# Patient Record
Sex: Female | Born: 1985 | Race: Black or African American | Hispanic: No | Marital: Single | Smoking: Never smoker
Health system: Southern US, Community
[De-identification: ages and names within clinical notes are randomized; demographics above are authoritative.]

## PROBLEM LIST (undated history)

## (undated) DIAGNOSIS — D649 Anemia, unspecified: Secondary | ICD-10-CM

## (undated) HISTORY — PX: EYE SURGERY: SHX253

---

## 2003-05-21 ENCOUNTER — Other Ambulatory Visit: Admission: RE | Admit: 2003-05-21 | Discharge: 2003-05-21 | Payer: Self-pay | Admitting: Obstetrics and Gynecology

## 2003-05-24 ENCOUNTER — Encounter: Payer: Self-pay | Admitting: Obstetrics and Gynecology

## 2003-05-24 ENCOUNTER — Encounter: Admission: RE | Admit: 2003-05-24 | Discharge: 2003-05-24 | Payer: Self-pay | Admitting: Obstetrics and Gynecology

## 2004-09-15 ENCOUNTER — Other Ambulatory Visit: Admission: RE | Admit: 2004-09-15 | Discharge: 2004-09-15 | Payer: Self-pay | Admitting: Obstetrics and Gynecology

## 2004-09-20 ENCOUNTER — Other Ambulatory Visit: Admission: RE | Admit: 2004-09-20 | Discharge: 2004-09-20 | Payer: Self-pay | Admitting: Obstetrics and Gynecology

## 2005-03-26 ENCOUNTER — Ambulatory Visit (HOSPITAL_COMMUNITY): Admission: RE | Admit: 2005-03-26 | Discharge: 2005-03-26 | Payer: Self-pay | Admitting: Obstetrics and Gynecology

## 2005-04-01 ENCOUNTER — Ambulatory Visit (HOSPITAL_COMMUNITY): Admission: RE | Admit: 2005-04-01 | Discharge: 2005-04-01 | Payer: Self-pay | Admitting: Obstetrics and Gynecology

## 2005-11-18 ENCOUNTER — Inpatient Hospital Stay (HOSPITAL_COMMUNITY): Admission: AD | Admit: 2005-11-18 | Discharge: 2005-11-20 | Payer: Self-pay | Admitting: *Deleted

## 2005-12-31 ENCOUNTER — Other Ambulatory Visit: Admission: RE | Admit: 2005-12-31 | Discharge: 2005-12-31 | Payer: Self-pay | Admitting: Obstetrics and Gynecology

## 2006-05-09 ENCOUNTER — Emergency Department (HOSPITAL_COMMUNITY): Admission: EM | Admit: 2006-05-09 | Discharge: 2006-05-09 | Payer: Self-pay | Admitting: Emergency Medicine

## 2006-06-13 IMAGING — US US OB TRANSVAGINAL MODIFY
1 series · 14 of 28 positions shown · non-contrast
Comparison: none

CLINICAL DATA: Known gestation.  Assess for viability and dates.

[Series 1: us ob transvaginal modify · 0.25mm/px · 14 of 53 slices shown]
[im 2/53]
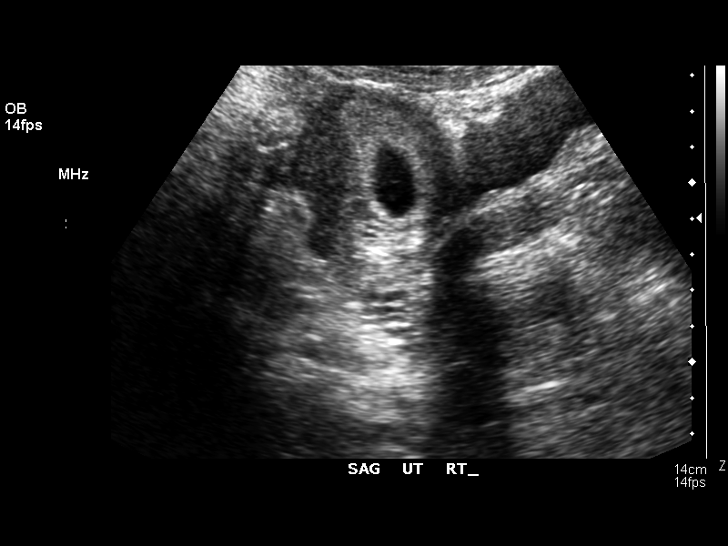
[im 6/53]
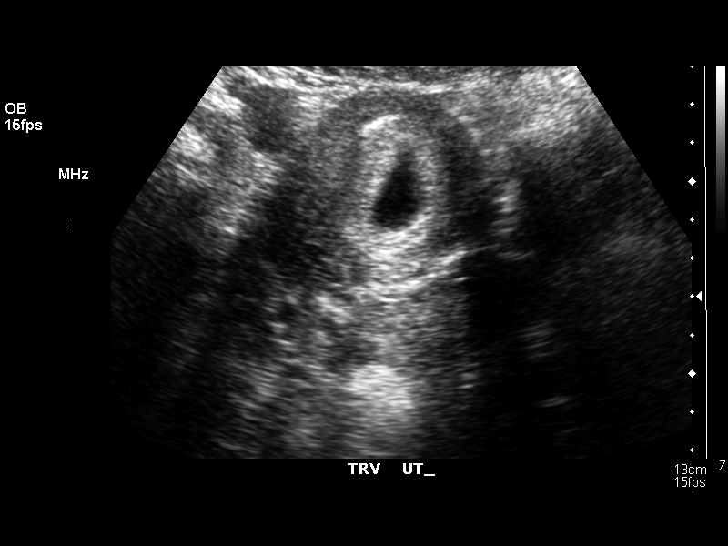
[im 10/53]
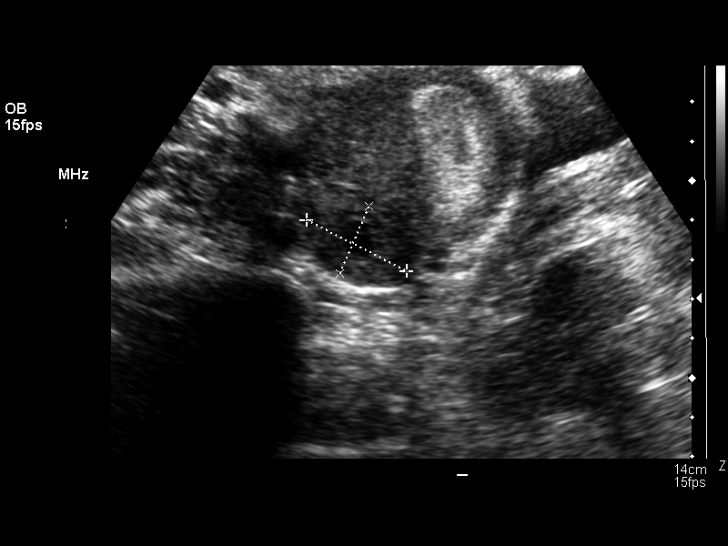
[im 14/53]
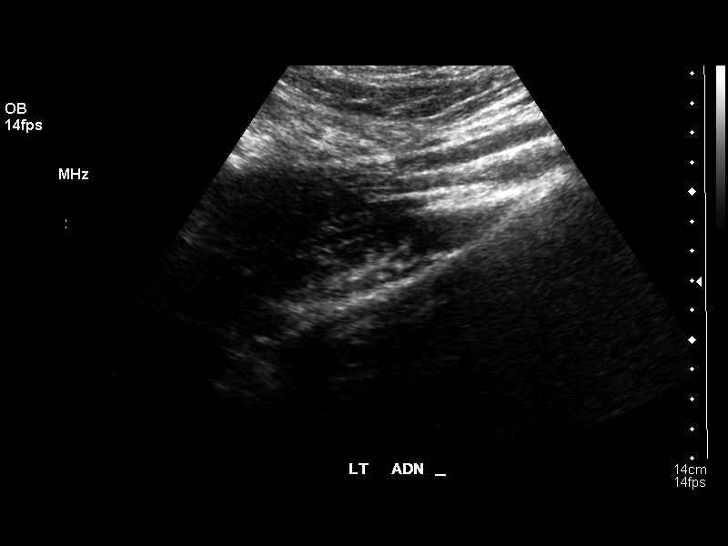
[im 18/53]
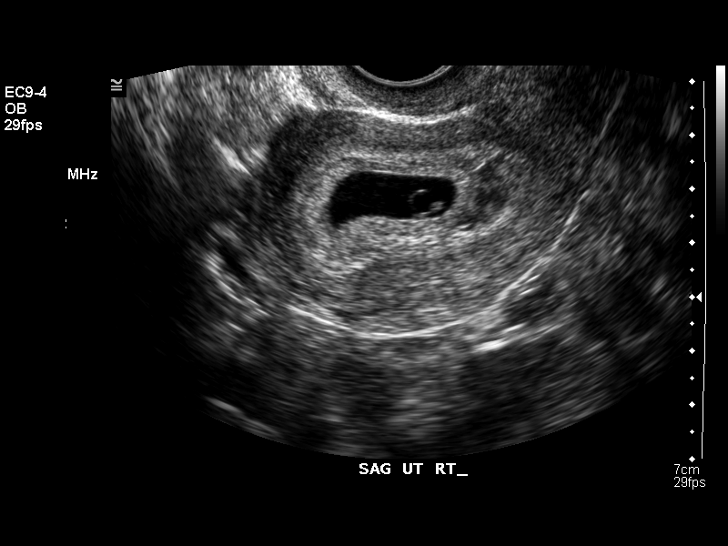
[im 22/53]
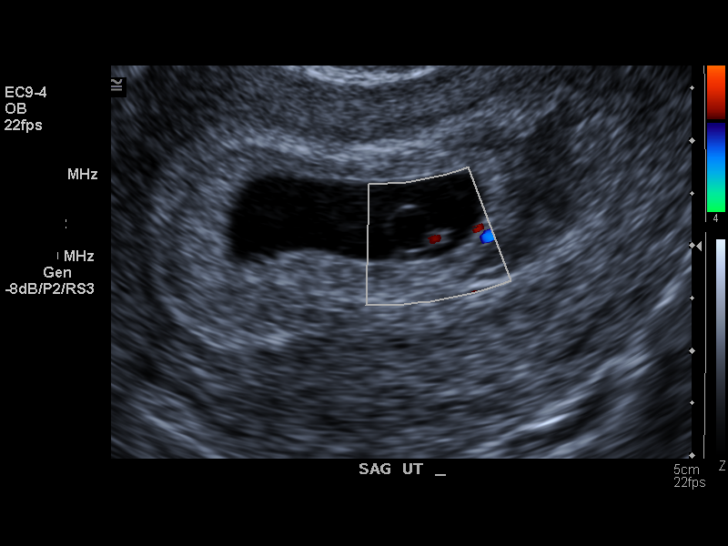
[im 26/53]
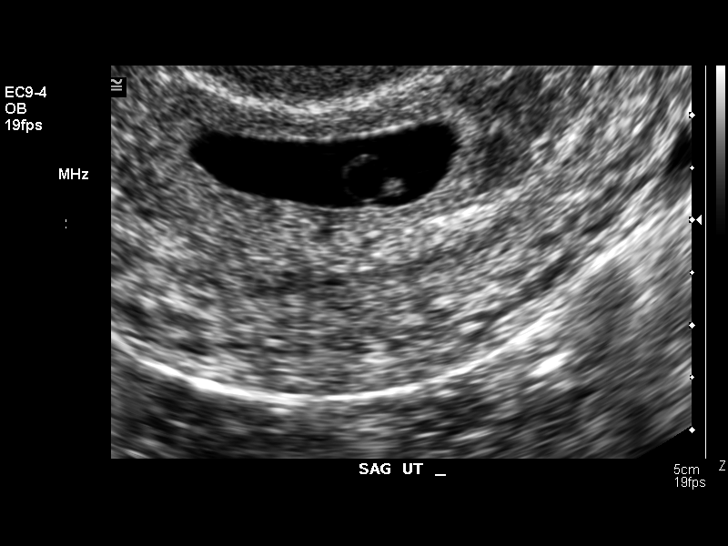
[im 29/53]
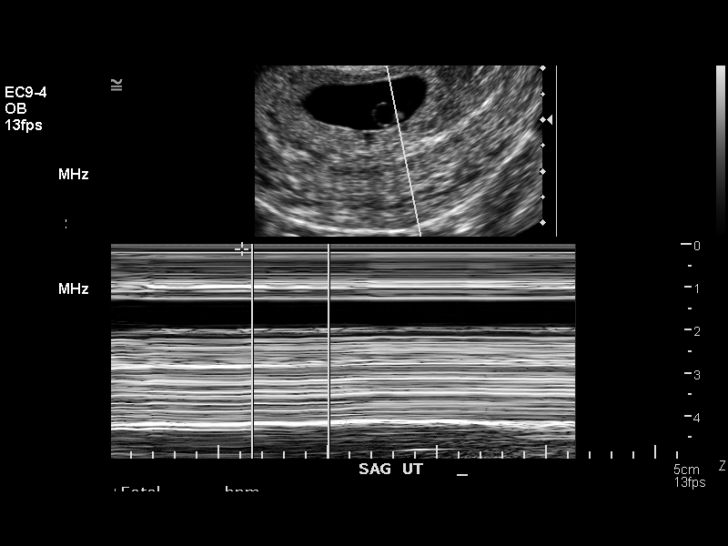
[im 33/53]
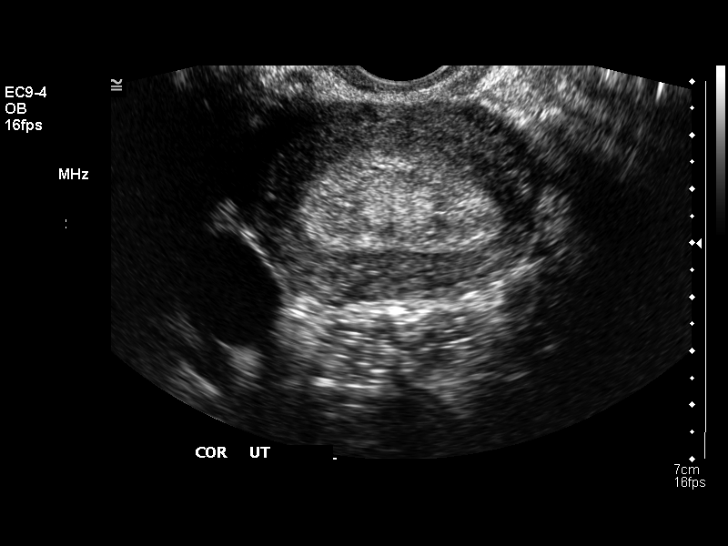
[im 37/53]
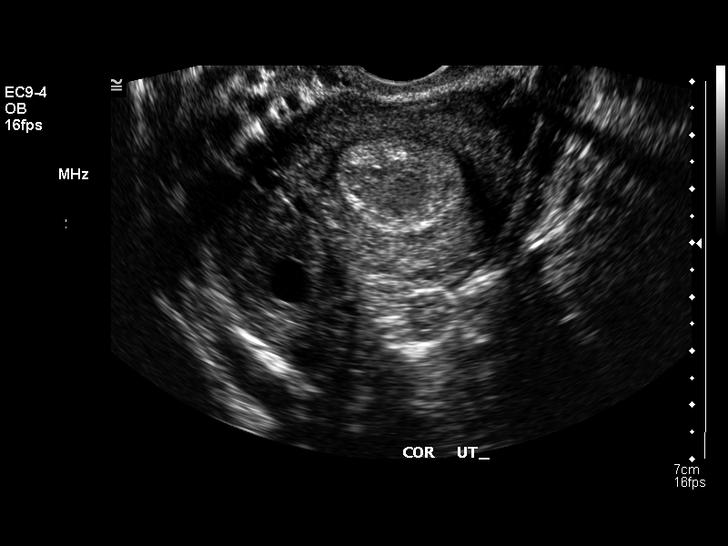
[im 41/53]
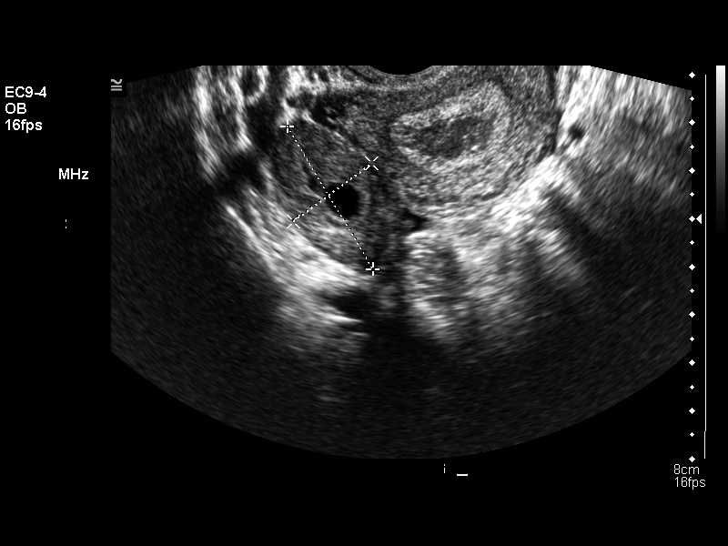
[im 45/53]
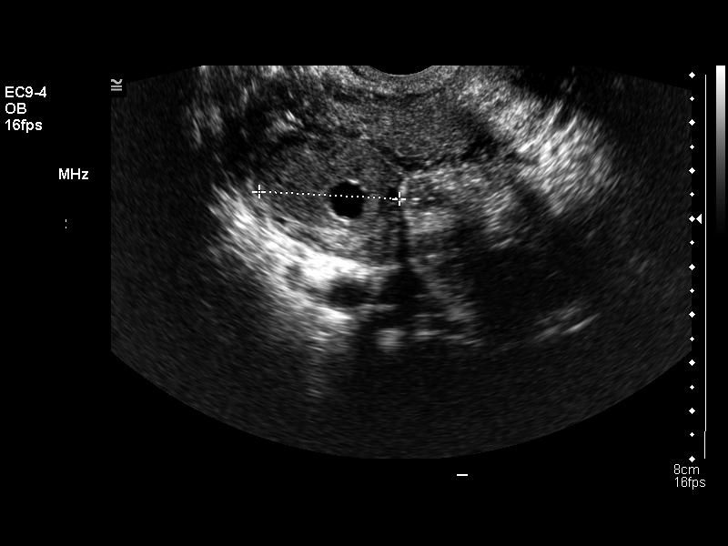
[im 49/53]
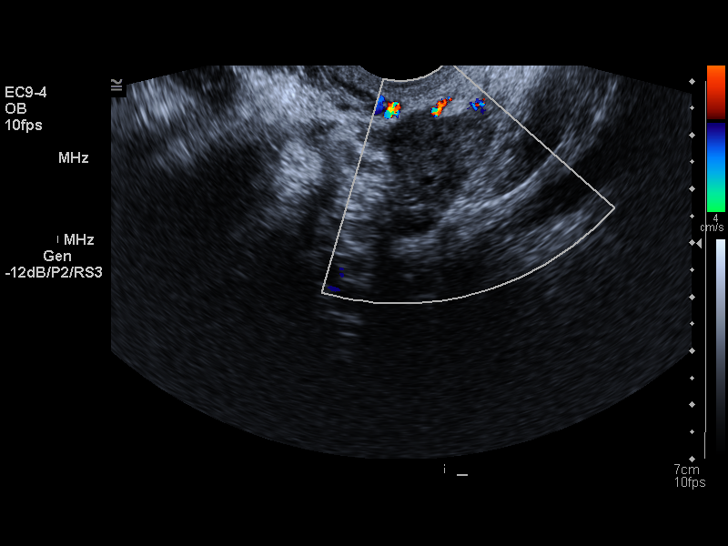
[im 53/53]
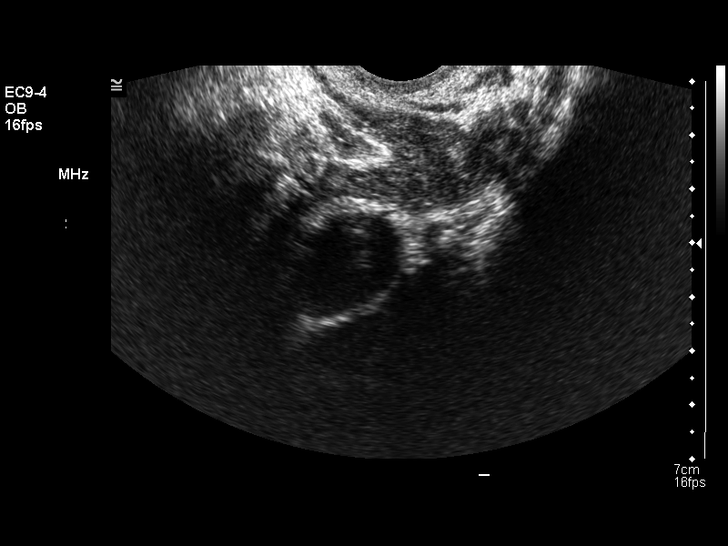

[14 of 28 positions shown; findings below may reference images not displayed]

EARLY OBSTETRICAL ULTRASOUND WITH TRANSVAGINAL:
 Transabdominal and endovaginal scanning was performed.
 Single intrauterine gestational sac is identified with a yolk sac and tiny embryo.  Crown rump length of the embryo is 2.1 mm which correlates to an estimated gestational age of 5 weeks 5 days.  There is a small associated subchorionic hemorrhage.
 The ovaries are sonographically normal with a corpus luteum cyst seen on the right.  There is a trace amount of simple appearing free fluid in the cul-de-sac.
IMPRESSION: Single living intrauterine gestation at 5 weeks 5 days by crown rump length.  The embryonic heart rate is measured at 88 bpm.  Short term interval follow-up ultrasound is recommended to insure appropriate pregnancy progression and correlation with quantitative beta hCG may prove helpful.

## 2009-03-07 ENCOUNTER — Emergency Department (HOSPITAL_COMMUNITY): Admission: EM | Admit: 2009-03-07 | Discharge: 2009-03-07 | Payer: Self-pay | Admitting: Emergency Medicine

## 2011-03-17 LAB — DIFFERENTIAL
Basophils Relative: 0 % (ref 0–1)
Eosinophils Absolute: 0 10*3/uL (ref 0.0–0.7)
Lymphs Abs: 1 10*3/uL (ref 0.7–4.0)
Monocytes Relative: 7 % (ref 3–12)
Neutro Abs: 8 10*3/uL — ABNORMAL HIGH (ref 1.7–7.7)
Neutrophils Relative %: 82 % — ABNORMAL HIGH (ref 43–77)

## 2011-03-17 LAB — URINE MICROSCOPIC-ADD ON

## 2011-03-17 LAB — URINALYSIS, ROUTINE W REFLEX MICROSCOPIC
Glucose, UA: NEGATIVE mg/dL
Protein, ur: NEGATIVE mg/dL
pH: 7 (ref 5.0–8.0)

## 2011-03-17 LAB — URINE CULTURE

## 2011-03-17 LAB — WET PREP, GENITAL

## 2011-03-17 LAB — POCT I-STAT, CHEM 8
Chloride: 106 mEq/L (ref 96–112)
HCT: 38 % (ref 36.0–46.0)
Hemoglobin: 12.9 g/dL (ref 12.0–15.0)
Potassium: 3.8 mEq/L (ref 3.5–5.1)
Sodium: 140 mEq/L (ref 135–145)

## 2011-03-17 LAB — CBC
MCHC: 35.2 g/dL (ref 30.0–36.0)
MCV: 96.5 fL (ref 78.0–100.0)
Platelets: 224 10*3/uL (ref 150–400)
RBC: 3.58 MIL/uL — ABNORMAL LOW (ref 3.87–5.11)
WBC: 9.7 10*3/uL (ref 4.0–10.5)

## 2011-03-17 LAB — GC/CHLAMYDIA PROBE AMP, GENITAL: GC Probe Amp, Genital: POSITIVE — AB

## 2011-04-23 NOTE — H&P (Signed)
NAME:  Stambaugh, Idalia                  ACCOUNT NO.:  [id number]   MEDICAL RECORD NO.:  [id number]          PATIENT TYPE:  INP   LOCATION:  9167                          FACILITY:  [facility]   PHYSICIAN:  Hal Morales, M.D.DATE OF BIRTH:  November 01, 1986   DATE OF ADMISSION:  11/18/2005  DATE OF DISCHARGE:                                HISTORY & PHYSICAL   Ms. Fuquay is a 25 year old gravida 1, para 0, who is admitted at 39 weeks  and 4 days gestation with spontaneous rupture of membranes at approximately  3 a.m. with a small amount of clear fluid.  Patient reports that had initial  leakage then and then none until a few hours later when she had another  small gush of fluid.  The patient reports that she has been having  contractions approximately every 20 minutes during the night to  approximately every 10 minutes at the time of her office visit.  Patient  reports that her fetus is moving normally.  Patient denies any bleeding.  Patient's pregnancy has been remarkable for:  1.  First trimester trichomonas.  2.  Positive group B strep.   PRENATAL LABORATORY:  Initial hemoglobin 11.4, platelets 280,000, blood type  O+, antibody screen negative, sickle cell trait negative, RPR non-reactive,  rubella titer immune, hepatitis B surface antigen negative, HIV non-  reactive, cystic fibrosis negative, Pap smear October of 2005 within normal  limits, gonorrhea and chlamydia negative, quad screen was negative.  Hemoglobin at 28 weeks 11.6, Glucola within normal limits, cystic fibrosis  negative, and then group B strep test is positive at 35 weeks.   HISTORY OF PRESENT PREGNANCY:  Patient entered prenatal care at [redacted] weeks  gestation.  Patient's EDC was established by ultrasound done at 5 weeks and  5 days gestation.  The patient's pregnancy has been followed by the CNM  Service at [facility].  The patient was treated for trichomonas  within the first trimester, patient with some nausea and  vomiting in the  first trimester treated with Phenergan with good relief.  Patient underwent  ultrasound for anatomy at [redacted] weeks gestation revealing a posterior placenta,  no previa, normal amniotic fluid, cervix 3.7 cm and a normal anatomy in a  female fetus.  The patient had a small amount of spotting at [redacted] weeks  gestation, which resolved spontaneously with no further bleeding.  The  patient with GBS and gonorrhea and chlamydia done at 35 weeks with positive  GBS and negative gonorrhea and chlamydia.  The patient's cervix was closed  and long at [redacted] weeks gestation, as well as [redacted] weeks gestation.  The  remainder of patients prenatal course has been unremarkable.   OBSTETRIC HISTORY:  The patient is a gravida 1 set present pregnancy.   GYNECOLOGIC HISTORY:  Patient denies history of abnormal Pap smears and only  STD has been trichomonas.  The patient reports irregular menses every 30 to  60 days.   PAST MEDICAL HISTORY:  Negative.   PAST SURGICAL HISTORY:  Negative.   GENETIC HISTORY:  Negative with limited information  regarding the father of  the baby's family.   FAMILY HISTORY:  Paternal grandfather and Alzheimer's.  Mother with cervical  cancer.   SOCIAL HISTORY:  Patient is single.  The patient denies use of alcohol,  tobacco, or street drugs.  Patient's domestic violence screen is negative.  The father of the baby, Clifton Custard, is involved and supportive.   ALLERGIES:  NO KNOWN DRUG ALLERGIES.   MEDICATIONS:  Prenatal vitamins only.   PHYSICAL EXAMINATION:  VITAL SIGNS:  The patient is afebrile.  Vital signs  are stable.  HEENT:  Within normal limits.  HEART:  Regular rate and rhythm without murmur, rub or gallop.  LUNGS:  Clear.  BREASTS:  Soft.  ABDOMEN:  Soft and gravid with fundal height extending 38 cm above the  emphasis pubis.  Fetus noted to be longitudinal lie and vertex to Leopold's  maneuvers.  Fetal heart tones are present in the 140s.  CERVIX:  1 cm dilated, 80%  effaced, -2 station, small amount of clear  discharge noted in the vaginal vault.  A small amount of pooling noted to be  present.  Secretions are noted to be Nitrazine positive, as well as foreign  positive.  EXTREMITIES:  No edema is noted to be present.  Hohmann's sign is negative  bilaterally and deep tendon reflexes are 1+, no clonus.   ASSESSMENT:  1.  Spontaneously rupture of membranes at term.  2.  Positive group B strep.   PLAN:  1.  Patient to be admitted to [facility] per consult with Dr. Pennie Rushing.      Patient will be started on Penicillin G for group B strep prophylaxis.      Augmentation of labor to be discussed with patient upon admission to      [facility] after assessment of uterine contraction frequency.      Rhona Leavens, CNM      Hal Morales, M.D.  Electronically Signed    NOS/MEDQ  D:  11/18/2005  T:  11/18/2005  Job:  [id number]
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
## 2016-01-15 ENCOUNTER — Emergency Department (HOSPITAL_COMMUNITY)
Admission: EM | Admit: 2016-01-15 | Discharge: 2016-01-15 | Disposition: A | Discharge status: Home / Self Care | Payer: Managed Care, Other (non HMO) | Attending: Emergency Medicine | Admitting: Emergency Medicine

## 2016-01-15 ENCOUNTER — Encounter (HOSPITAL_COMMUNITY): Payer: Self-pay

## 2016-01-15 DIAGNOSIS — Z3202 Encounter for pregnancy test, result negative: Secondary | ICD-10-CM | POA: Insufficient documentation

## 2016-01-15 DIAGNOSIS — R109 Unspecified abdominal pain: Secondary | ICD-10-CM | POA: Diagnosis present

## 2016-01-15 DIAGNOSIS — K5901 Slow transit constipation: Secondary | ICD-10-CM | POA: Diagnosis not present

## 2016-01-15 LAB — URINALYSIS, ROUTINE W REFLEX MICROSCOPIC
Bilirubin Urine: NEGATIVE
Glucose, UA: NEGATIVE mg/dL
HGB URINE DIPSTICK: NEGATIVE
Ketones, ur: NEGATIVE mg/dL
LEUKOCYTES UA: NEGATIVE
Nitrite: NEGATIVE
PROTEIN: NEGATIVE mg/dL
Specific Gravity, Urine: 1.009 (ref 1.005–1.030)
pH: 7 (ref 5.0–8.0)

## 2016-01-15 LAB — POC URINE PREG, ED: PREG TEST UR: NEGATIVE

## 2016-01-15 NOTE — ED Provider Notes (Signed)
CSN: [id number]     Arrival date & time 01/15/16  1226 History   First MD Initiated Contact with Patient 01/15/16 1600     Chief Complaint  Patient presents with  . Abdominal Pain     (Consider location/radiation/quality/duration/timing/severity/associated sxs/prior Treatment) HPI    Morgan Mann is a 30 y.o. female who presents for evaluation of a episode of cramping after drinking a Tea, to stimulate bowel movements. The discomfort has resolved. She denies fever, chills, nausea, vomiting, weakness or dizziness. The pain recurred today and lasted about 20 minutes. Has irregular bowels, last about 5 days ago, he typically has hard bowel movements. She denies that her discharge, dysuria, urinary frequency, or abnormal periods. Last sexual intercourse encounter was 3 months ago. There are no other known modifying factors.   History reviewed. No pertinent past medical history. History reviewed. No pertinent past surgical history. No family history on file. Social History  Substance Use Topics  . Smoking status: Never Smoker   . Smokeless tobacco: None  . Alcohol Use: None   OB History    No data available     Review of Systems  All other systems reviewed and are negative.     Allergies  Review of patient's allergies indicates not on file.  Home Medications   Prior to Admission medications   Not on File   BP 126/89 mmHg  Pulse 56  Temp(Src) 97.2 F (36.2 C) (Oral)  Resp 18  Ht  (1.727 m)  Wt 197 lb (89.359 kg)  BMI 29.96 kg/m2  SpO2 100%  LMP 01/01/2016 Physical Exam  Constitutional: She is oriented to person, place, and time. She appears well-developed and well-nourished.  HENT:  Head: Normocephalic and atraumatic.  Right Ear: External ear normal.  Left Ear: External ear normal.  Eyes: Conjunctivae and EOM are normal. Pupils are equal, round, and reactive to light.  Neck: Normal range of motion and phonation normal. Neck supple.  Cardiovascular: Normal  rate and regular rhythm.   Pulmonary/Chest: Effort normal. She exhibits no bony tenderness.  Abdominal: Soft. There is no tenderness. There is no guarding.  Musculoskeletal: Normal range of motion.  Neurological: She is alert and oriented to person, place, and time. No cranial nerve deficit or sensory deficit. She exhibits normal muscle tone. Coordination normal.  Skin: Skin is warm, dry and intact.  Psychiatric: She has a normal mood and affect. Her behavior is normal. Judgment and thought content normal.  Nursing note and vitals reviewed.   ED Course  Procedures (including critical care time) Medications - No data to display  Patient Vitals for the past 24 hrs:  BP Temp Temp src Pulse Resp SpO2 Height Weight  01/15/16 1615 126/89 mmHg - - (!) 56 18 100 % - -  01/15/16 1232 146/98 mmHg 97.2 F (36.2 C) Oral 66 18 100 %  (1.727 m) 197 lb (89.359 kg)    4:23 PM Reevaluation with update and discussion. After initial assessment and treatment, an updated evaluation reveals she is comfortable, has absolutely no abdominal pain and understands the discharge instructions. All questions answered. Donivin Wirt L    Labs Review Labs Reviewed  URINALYSIS, ROUTINE W REFLEX MICROSCOPIC (NOT AT [facility])  POC URINE PREG, ED    Imaging Review No results found. I have personally reviewed and evaluated these images and lab results as part of my medical decision-making.   EKG Interpretation None      MDM   Final diagnoses:  Constipation by  delayed colonic transit    Constipation with intestinal cramping, resolved spontaneously. No evidence for bowel obstruction, suggestion for infection or impending vascular collapse.  Nursing Notes Reviewed/ Care Coordinated Applicable Imaging Reviewed Interpretation of Laboratory Data incorporated into ED treatment  The patient appears reasonably screened and/or stabilized for discharge and I doubt any other medical condition or other [facility]  requiring further screening, evaluation, or treatment in the ED at this time prior to discharge.  Plan: Home Medications- Miralax BID/QD; Home Treatments- rest, fluids, fiber; return here if the recommended treatment, does not improve the symptoms; Recommended follow up- PCP prn     Mancel Bale, MD 01/15/16 1625

## 2016-01-15 NOTE — ED Notes (Signed)
Patient here with lower abdominal pain since am, reports urinary pressure with same. Took 2 ale\ve this am

## 2016-01-15 NOTE — Discharge Instructions (Signed)
Use Miralax twice a day until you have a bowel movement, then once a day for 2 weeks.  Constipation, Adult Constipation is when a person has fewer than three bowel movements a week, has difficulty having a bowel movement, or has stools that are dry, hard, or larger than normal. As people grow older, constipation is more common. A low-fiber diet, not taking in enough fluids, and taking certain medicines may make constipation worse.  CAUSES   Certain medicines, such as antidepressants, pain medicine, iron supplements, antacids, and water pills.   Certain diseases, such as diabetes, irritable bowel syndrome (IBS), thyroid disease, or depression.   Not drinking enough water.   Not eating enough fiber-rich foods.   Stress or travel.   Lack of physical activity or exercise.   Ignoring the urge to have a bowel movement.   Using laxatives too much.  SIGNS AND SYMPTOMS   Having fewer than three bowel movements a week.   Straining to have a bowel movement.   Having stools that are hard, dry, or larger than normal.   Feeling full or bloated.   Pain in the lower abdomen.   Not feeling relief after having a bowel movement.  DIAGNOSIS  Your health care provider will take a medical history and perform a physical exam. Further testing may be done for severe constipation. Some tests may include:  A barium enema X-ray to examine your rectum, colon, and, sometimes, your small intestine.   A sigmoidoscopy to examine your lower colon.   A colonoscopy to examine your entire colon. TREATMENT  Treatment will depend on the severity of your constipation and what is causing it. Some dietary treatments include drinking more fluids and eating more fiber-rich foods. Lifestyle treatments may include regular exercise. If these diet and lifestyle recommendations do not help, your health care provider may recommend taking over-the-counter laxative medicines to help you have bowel movements.  Prescription medicines may be prescribed if over-the-counter medicines do not work.  HOME CARE INSTRUCTIONS   Eat foods that have a lot of fiber, such as fruits, vegetables, whole grains, and beans.  Limit foods high in fat and processed sugars, such as french fries, hamburgers, cookies, candies, and soda.   A fiber supplement may be added to your diet if you cannot get enough fiber from foods.   Drink enough fluids to keep your urine clear or pale yellow.   Exercise regularly or as directed by your health care provider.   Go to the restroom when you have the urge to go. Do not hold it.   Only take over-the-counter or prescription medicines as directed by your health care provider. Do not take other medicines for constipation without talking to your health care provider first.  SEEK IMMEDIATE MEDICAL CARE IF:   You have bright red blood in your stool.   Your constipation lasts for more than 4 days or gets worse.   You have abdominal or rectal pain.   You have thin, pencil-like stools.   You have unexplained weight loss. MAKE SURE YOU:   Understand these instructions.  Will watch your condition.  Will get help right away if you are not doing well or get worse.   This information is not intended to replace advice given to you by your health care provider. Make sure you discuss any questions you have with your health care provider.   Document Released: 08/20/2004 Document Revised: 12/13/2014 Document Reviewed: 09/03/2013 Elsevier Interactive Patient Education [employer]

## 2016-07-06 ENCOUNTER — Emergency Department (HOSPITAL_COMMUNITY): Payer: Managed Care, Other (non HMO)

## 2016-07-06 ENCOUNTER — Encounter (HOSPITAL_COMMUNITY): Payer: Self-pay

## 2016-07-06 ENCOUNTER — Emergency Department (HOSPITAL_COMMUNITY)
Admission: EM | Admit: 2016-07-06 | Discharge: 2016-07-06 | Disposition: A | Discharge status: Home / Self Care | Payer: Managed Care, Other (non HMO) | Attending: Emergency Medicine | Admitting: Emergency Medicine

## 2016-07-06 DIAGNOSIS — Y92410 Unspecified street and highway as the place of occurrence of the external cause: Secondary | ICD-10-CM | POA: Diagnosis not present

## 2016-07-06 DIAGNOSIS — Y999 Unspecified external cause status: Secondary | ICD-10-CM | POA: Diagnosis not present

## 2016-07-06 DIAGNOSIS — Y939 Activity, unspecified: Secondary | ICD-10-CM | POA: Insufficient documentation

## 2016-07-06 DIAGNOSIS — M25511 Pain in right shoulder: Secondary | ICD-10-CM | POA: Insufficient documentation

## 2016-07-06 DIAGNOSIS — M545 Low back pain: Secondary | ICD-10-CM | POA: Insufficient documentation

## 2016-07-06 DIAGNOSIS — M7918 Myalgia, other site: Secondary | ICD-10-CM

## 2016-07-06 LAB — POC URINE PREG, ED: Preg Test, Ur: NEGATIVE

## 2016-07-06 MED ORDER — IBUPROFEN 400 MG PO TABS
400.0000 mg | ORAL_TABLET | Freq: Once | ORAL | Status: AC
  Administered 2016-07-06: 400 mg via ORAL
  Filled 2016-07-06: qty 1
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
## 2016-07-06 NOTE — ED Notes (Signed)
Patient able to ambulate independently  
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
## 2016-07-06 NOTE — ED Notes (Signed)
Pt transported to xray 
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
## 2018-12-06 NOTE — L&D Delivery Note (Addendum)
Delivery Note  In room to see patient at [facility] after being called for precipitous delivery. Viable female infant delivered by Adalberto Cole, RN at [phone]. Infant skin to skin. Delayed cord clamping, three vessel cord, and cord blood collected. APGARs: 8, 9. Weight pending. Receiving nurse present at bedside.   Pitocin infusing. Spontaneous delivery of intact placenta at 0022. Uterus firm. Rubra small. Perineum intact. Anesthesia: none. Vault check completed. Counts correct x 2. QBL pending.   Initiate routine postpartum care. Mom to postpartum.  Baby to Couplet care / Skin to Skin.  FOB present at bedside and overjoyed with the birth of "Jewel".    Diona Fanti, CNM [facility], [facility] 07/15/2019, 12:30 AM

## 2019-02-28 ENCOUNTER — Ambulatory Visit (INDEPENDENT_AMBULATORY_CARE_PROVIDER_SITE_OTHER): Payer: PRIVATE HEALTH INSURANCE | Admitting: Obstetrics and Gynecology

## 2019-02-28 ENCOUNTER — Encounter: Payer: Self-pay | Admitting: Obstetrics and Gynecology

## 2019-02-28 ENCOUNTER — Other Ambulatory Visit: Payer: Self-pay

## 2019-02-28 ENCOUNTER — Other Ambulatory Visit (HOSPITAL_COMMUNITY)
Admission: RE | Admit: 2019-02-28 | Discharge: 2019-02-28 | Disposition: A | Discharge status: Home / Self Care | Payer: Managed Care, Other (non HMO) | Source: Ambulatory Visit | Attending: Obstetrics and Gynecology | Admitting: Obstetrics and Gynecology

## 2019-02-28 VITALS — BP 123/85 | HR 96 | Wt 219.2 lb

## 2019-02-28 DIAGNOSIS — Z3A18 18 weeks gestation of pregnancy: Secondary | ICD-10-CM

## 2019-02-28 DIAGNOSIS — Z6829 Body mass index (BMI) 29.0-29.9, adult: Secondary | ICD-10-CM

## 2019-02-28 DIAGNOSIS — Z3492 Encounter for supervision of normal pregnancy, unspecified, second trimester: Secondary | ICD-10-CM

## 2019-02-28 DIAGNOSIS — O0932 Supervision of pregnancy with insufficient antenatal care, second trimester: Secondary | ICD-10-CM

## 2019-02-28 LAB — OB RESULTS CONSOLE VARICELLA ZOSTER ANTIBODY, IGG: Varicella: IMMUNE

## 2019-02-28 NOTE — Progress Notes (Signed)
NOB PE- delay entry to care, did NOB PE interview with pt, we discussed financial policy and FMLA form signed, advised pt we would do labs today, she is doing well

## 2019-02-28 NOTE — Progress Notes (Signed)
NEW OB HISTORY AND PHYSICAL  SUBJECTIVE:       Morgan Mann is a 33 y.o. [id number] female, Patient's last menstrual period was 10/19/2018., Estimated Date of Delivery: 07/26/19, [redacted]w[redacted]d, presents today for establishment of Prenatal Care. She has no unusual complaints and no complaints  At this time. Lives with boyfriend and 59 yo son. Works FT in a [employer].      Gynecologic History Patient's last menstrual period was 10/19/2018. Normal Contraception: none Last Pap: 2014. Results were: normal  Obstetric History OB History  Gravida Para Term Preterm AB Living  5 1     3     SAB TAB Ectopic Multiple Live Births    3          # Outcome Date GA Lbr Len/2nd Weight Sex Delivery Anes PTL Lv  5 Current           4 TAB 2015          3 TAB 2010          2 TAB 2009          1 Para 2006     Vag-Spont       History reviewed. No pertinent past medical history.  History reviewed. No pertinent surgical history.  Current Outpatient Medications on File Prior to Visit  Medication Sig Dispense Refill  . acetaminophen (TYLENOL) 500 MG tablet Take 500 mg by mouth every 6 (six) hours as needed.    . Prenatal Vit-Fe Fumarate-FA (PRENATAL MULTIVITAMIN) TABS tablet Take 1 tablet by mouth daily at 12 noon.     No current facility-administered medications on file prior to visit.     No Known Allergies  Social History   Socioeconomic History  . Marital status: Single    Spouse name: Not on file  . Number of children: Not on file  . Years of education: Not on file  . Highest education level: Not on file  Occupational History  . Not on file  Social Needs  . Financial resource strain: Not on file  . Food insecurity:    Worry: Not on file    Inability: Not on file  . Transportation needs:    Medical: Not on file    Non-medical: Not on file  Tobacco Use  . Smoking status: Never Smoker  . Smokeless tobacco: Never Used  Substance and Sexual Activity  . Alcohol use: No  .  Drug use: Never  . Sexual activity: Yes  Lifestyle  . Physical activity:    Days per week: Not on file    Minutes per session: Not on file  . Stress: Not on file  Relationships  . Social connections:    Talks on phone: Not on file    Gets together: Not on file    Attends religious service: Not on file    Active member of club or organization: Not on file    Attends meetings of clubs or organizations: Not on file    Relationship status: Not on file  . Intimate partner violence:    Fear of current or ex partner: Not on file    Emotionally abused: Not on file    Physically abused: Not on file    Forced sexual activity: Not on file  Other Topics Concern  . Not on file  Social History Narrative  . Not on file    History reviewed. No pertinent family history.  The following portions of the patient's history were reviewed  and updated as appropriate: allergies, current medications, past OB history, past medical history, past surgical history, past family history, past social history, and problem list.    OBJECTIVE: Initial Physical Exam (New OB)  GENERAL APPEARANCE: alert, well appearing, in no apparent distress, oriented to person, place and time, overweight HEAD: normocephalic, atraumatic MOUTH: mucous membranes moist, pharynx normal without lesions and dental hygiene good THYROID: no thyromegaly or masses present BREASTS: no masses noted, no significant tenderness, no palpable axillary nodes, no skin changes LUNGS: clear to auscultation, no wheezes, rales or rhonchi, symmetric air entry HEART: regular rate and rhythm, no murmurs ABDOMEN: soft, nontender, nondistended, no abnormal masses, no epigastric pain, fundus soft, nontender 20 weeks size and FHT present EXTREMITIES: no redness or tenderness in the calves or thighs SKIN: normal coloration and turgor, no rashes LYMPH NODES: no adenopathy palpable NEUROLOGIC: alert, oriented, normal speech, no focal findings or movement  disorder noted  PELVIC EXAM EXTERNAL GENITALIA: normal appearing vulva with no masses, tenderness or lesions VAGINA: no abnormal discharge or lesions CERVIX: no lesions or cervical motion tenderness UTERUS: gravid and consistent with 20 weeks ADNEXA: no masses palpable and nontender  ASSESSMENT: Normal pregnancy AMA at delivery Late entry to care BMI 29   PLAN: Prenatal care Desires genetic screening- panarama and horiz[facility] obtained today See orders

## 2019-02-28 NOTE — Patient Instructions (Signed)
Common Medications Safe in Pregnancy  Acne:      Constipation:  Benzoyl Peroxide     Colace  Clindamycin      Dulcolax Suppository  Topica Erythromycin     Fibercon  Salicylic Acid      Metamucil         Miralax AVOID:        Senakot   Accutane    Cough:  Retin-A       Cough Drops  Tetracycline      Phenergan w/ Codeine if Rx  Minocycline      Robitussin (Plain & DM)  Antibiotics:     Crabs/Lice:  Ceclor       RID  Cephalosporins    AVOID:  E-Mycins      Kwell  Keflex  Macrobid/Macrodantin   Diarrhea:  Penicillin      Kao-Pectate  Zithromax      Imodium AD         PUSH FLUIDS AVOID:       Cipro     Fever:  Tetracycline      Tylenol (Regular or Extra  Minocycline       Strength)  Levaquin      Extra Strength-Do not          Exceed 8 tabs/24 hrs Caffeine:        <264m/day (equiv. To 1 cup of coffee or  approx. 3 12 oz sodas)         Gas: Cold/Hayfever:       Gas-X  Benadryl      Mylicon  Claritin       Phazyme  **Claritin-D        Chlor-Trimeton    Headaches:  Dimetapp      ASA-Free Excedrin  Drixoral-Non-Drowsy     Cold Compress  Mucinex (Guaifenasin)     Tylenol (Regular or Extra  Sudafed/Sudafed-12 Hour     Strength)  **Sudafed PE Pseudoephedrine   Tylenol Cold & Sinus     Vicks Vapor Rub  Zyrtec  **AVOID if Problems With Blood Pressure         Heartburn: Avoid lying down for at least 1 hour after meals  Aciphex      Maalox     Rash:  Milk of Magnesia     Benadryl    Mylanta       1% Hydrocortisone Cream  Pepcid  Pepcid Complete   Sleep Aids:  Prevacid      Ambien   Prilosec       Benadryl  Rolaids       Chamomile Tea  Tums (Limit 4/day)     Unisom  Zantac       Tylenol PM         Warm milk-add vanilla or  Hemorrhoids:       Sugar for taste  Anusol/Anusol H.C.  (RX: Analapram 2.5%)  Sugar Substitutes:  Hydrocortisone OTC     Ok in moderation  Preparation H      Tucks        Vaseline lotion applied to tissue with wiping    Herpes:      Throat:  Acyclovir      Oragel  Famvir  Valtrex     Vaccines:         Flu Shot Leg Cramps:       *Gardasil  Benadryl      Hepatitis A         Hepatitis B Nasal Spray:  Pneumovax  Saline Nasal Spray     Polio Booster         Tetanus Nausea:       Tuberculosis test or PPD  Vitamin B6 25 mg TID   AVOID:    Dramamine      *Gardasil  Emetrol       Live Poliovirus  Ginger Root 250 mg QID    MMR (measles, mumps &  High Complex Carbs @ Bedtime    rebella)  Sea Bands-Accupressure    Varicella (Chickenpox)  Unisom 1/2 tab TID     *No known complications           If received before Pain:         Known pregnancy;   Darvocet       Resume series after  Lortab        Delivery  Percocet    Yeast:   Tramadol      Femstat  Tylenol 3      Gyne-lotrimin  Ultram       Monistat  Vicodin           MISC:         All Sunscreens           Hair Coloring/highlights          Insect Repellant's          (Including DEET)         Mystic Tans Pregnancy After Age 80 Women who become pregnant after the age of 28 have a higher risk for certain problems during pregnancy. This is because older women may already have health problems before becoming pregnant. Older women who are healthy before pregnancy may still develop problems during pregnancy. These problems may affect the mother, the unborn baby (fetus), or both. What are the risks for me? If you are over age 29 and you want to become pregnant or are pregnant, you may have a higher risk of:  Not being able to get pregnant (infertility).  Going into labor early (preterm labor).  Needing surgical delivery of your baby (cesarean delivery, or C-section).  Having high blood pressure (hypertension).  Having complications during pregnancy, such as high blood pressure and other symptoms (preeclampsia).  Having diabetes during pregnancy (gestational diabetes).  Being pregnant with more than one baby.  Loss of the unborn baby before 20 weeks  (miscarriage) or after 20 weeks of pregnancy (stillbirth). What are the risks for my baby? Babies born to women over the age of 5 have a higher risk for:  Being born early (prematurity).  Low birth weight, which is less than 5 lb, 8 oz (2.5 kg).  Birth defects, such as Down syndrome and cleft palate.  Health complications, including problems with growth and development. How is prenatal care different for women over age 14? All women should see their health care provider before they try to become pregnant. This is especially important for women over the age of 91. Tell your health care provider about:  Any health problems you have.  Any medicines you take.  Any family history of health problems or chromosome-related defects.  Any problems you have had with past pregnancies or deliveries. If you are over age 68 and you plan to become pregnant:  Start taking a daily multivitamin a month or more before you try to get pregnant. Your multivitamin should contain 400 mcg (micrograms) of folic acid. If you are over age 61 and pregnant, make sure you:  Keep taking  your multivitamin unless your health care provider tells you not to take it.  Keep all prenatal visits as told by your health care provider. This is important.  Have ultrasounds regularly throughout your pregnancy to check for problems.  Talk with your health care provider about other prenatal screening tests that you may need. What additional prenatal tests are needed? Screening tests show whether your baby has a higher risk for birth defects than other babies. Screening tests include:  Ultrasound tests to look for markers that indicate a risk for birth defects.  Maternal blood screening. These are blood tests that measure certain substances in your blood to determine your baby's risk for defects. Screening tests do not show whether your baby has or does not have defects. They only show your baby's risk for certain defects.  If your screening tests show that risk factors are present, you may need tests to confirm the defect (diagnostic testing). These tests may include:  Chorionic villus sampling. For this procedure, a tissue sample is taken from the organ that forms in your uterus to nourish your baby (placenta). The sample is removed through your cervix or abdomen and tested.  Amniocentesis. For this procedure, a small amount of the fluid that surrounds the baby in the uterus (amniotic fluid) is removed and tested. What can I do to stay healthy during my pregnancy? Staying healthy during pregnancy can help you and your baby to have a lower risk for problems during pregnancy, during delivery, or both. Talk with your health care provider for specific instructions about staying healthy during your pregnancy. Nutrition   At each meal, eat a variety of foods from each of the five food groups. These groups include: ? Proteins such as lean meats, poultry, fish that is low in fat, beans, eggs, and nuts. ? Vegetables such as leafy greens, raw and cooked vegetables, and vegetable juice. ? Fruits that are fresh, frozen, or canned, or 100% fruit juice. ? Dairy products such as low-fat yogurt, cheese, and milk. ? Whole grains including rice, cereal, pasta, and bread.  Talk with your health care provider about how much food in each group is right for you.  Follow instructions from your health care provider about eating and drinking restrictions during pregnancy. ? Do not eat raw eggs, raw meat, or raw fish or seafood. ? Do not eat any fish that contains high amounts of mercury, such as swordfish or mackerel.  Drink 6-8 or more glasses of water a day. You should drink enough fluid to keep your urine pale yellow. Managing weight gain  Ask your health care provider how much weight gain is healthy during pregnancy.  Stay at a healthy weight. If needed, work with your health care provider to lose weight safely. Activity   Exercise regularly, as directed by your health care provider. Ask your health care provider what forms of exercise are safe for you. General instructions  Do not use any products that contain nicotine or tobacco, such as cigarettes and e-cigarettes. If you need help quitting, ask your health care provider.  Do not drink alcohol, use drugs, or abuse prescription medicine.  Take over-the-counter and prescription medicines only as told by your health care provider.  Do not use hot tubs, steam rooms, or saunas.  Talk with your health care provider about your risk of exposure to harmful environmental conditions. This includes exposure to chemicals, radiation, cleaning products, and cat feces. Follow advice from your health care provider about how to limit your  exposure. Summary  Women who become pregnant after the age of 60 have a higher risk for complications during pregnancy.  Problems may affect the mother, the unborn baby (fetus), or both.  All women should see their health care provider before they try to become pregnant. This is especially important for women over the age of 32.  Staying healthy during pregnancy can help both you and your baby to have a lower risk for some of the problems that can happen during pregnancy, during delivery, or both. This information is not intended to replace advice given to you by your health care provider. Make sure you discuss any questions you have with your health care provider. Document Released: 03/14/2017 Document Revised: 03/14/2017 Document Reviewed: 03/14/2017 Elsevier Interactive Patient Education  2019 [employer] of Pregnancy The second trimester is from week 14 through week 27 (months 4 through 6). The second trimester is often a time when you feel your best. Your body has adjusted to being pregnant, and you begin to feel better physically. Usually, morning sickness has lessened or quit completely, you may have more energy,  and you may have an increase in appetite. The second trimester is also a time when the fetus is growing rapidly. At the end of the sixth month, the fetus is about 9 inches long and weighs about 1 pounds. You will likely begin to feel the baby move (quickening) between 16 and 20 weeks of pregnancy. Body changes during your second trimester Your body continues to go through many changes during your second trimester. The changes vary from woman to woman.  Your weight will continue to increase. You will notice your lower abdomen bulging out.  You may begin to get stretch marks on your hips, abdomen, and breasts.  You may develop headaches that can be relieved by medicines. The medicines should be approved by your health care provider.  You may urinate more often because the fetus is pressing on your bladder.  You may develop or continue to have heartburn as a result of your pregnancy.  You may develop constipation because certain hormones are causing the muscles that push waste through your intestines to slow down.  You may develop hemorrhoids or swollen, bulging veins (varicose veins).  You may have back pain. This is caused by: ? Weight gain. ? Pregnancy hormones that are relaxing the joints in your pelvis. ? A shift in weight and the muscles that support your balance.  Your breasts will continue to grow and they will continue to become tender.  Your gums may bleed and may be sensitive to brushing and flossing.  Dark spots or blotches (chloasma, mask of pregnancy) may develop on your face. This will likely fade after the baby is born.  A dark line from your belly button to the pubic area (linea nigra) may appear. This will likely fade after the baby is born.  You may have changes in your hair. These can include thickening of your hair, rapid growth, and changes in texture. Some women also have hair loss during or after pregnancy, or hair that feels dry or thin. Your hair will most  likely return to normal after your baby is born. What to expect at prenatal visits During a routine prenatal visit:  You will be weighed to make sure you and the fetus are growing normally.  Your blood pressure will be taken.  Your abdomen will be measured to track your baby's growth.  The fetal heartbeat will be listened  to.  Any test results from the previous visit will be discussed. Your health care provider may ask you:  How you are feeling.  If you are feeling the baby move.  If you have had any abnormal symptoms, such as leaking fluid, bleeding, severe headaches, or abdominal cramping.  If you are using any tobacco products, including cigarettes, chewing tobacco, and electronic cigarettes.  If you have any questions. Other tests that may be performed during your second trimester include:  Blood tests that check for: ? Low iron levels (anemia). ? High blood sugar that affects pregnant women (gestational diabetes) between 9 and 28 weeks. ? Rh antibodies. This is to check for a protein on red blood cells (Rh factor).  Urine tests to check for infections, diabetes, or protein in the urine.  An ultrasound to confirm the proper growth and development of the baby.  An amniocentesis to check for possible genetic problems.  Fetal screens for spina bifida and Down syndrome.  HIV (human immunodeficiency virus) testing. Routine prenatal testing includes screening for HIV, unless you choose not to have this test. Follow these instructions at home: Medicines  Follow your health care provider's instructions regarding medicine use. Specific medicines may be either safe or unsafe to take during pregnancy.  Take a prenatal vitamin that contains at least 600 micrograms (mcg) of folic acid.  If you develop constipation, try taking a stool softener if your health care provider approves. Eating and drinking   Eat a balanced diet that includes fresh fruits and vegetables, whole  grains, good sources of protein such as meat, eggs, or tofu, and low-fat dairy. Your health care provider will help you determine the amount of weight gain that is right for you.  Avoid raw meat and uncooked cheese. These carry germs that can cause birth defects in the baby.  If you have low calcium intake from food, talk to your health care provider about whether you should take a daily calcium supplement.  Limit foods that are high in fat and processed sugars, such as fried and sweet foods.  To prevent constipation: ? Drink enough fluid to keep your urine clear or pale yellow. ? Eat foods that are high in fiber, such as fresh fruits and vegetables, whole grains, and beans. Activity  Exercise only as directed by your health care provider. Most women can continue their usual exercise routine during pregnancy. Try to exercise for 30 minutes at least 5 days a week. Stop exercising if you experience uterine contractions.  Avoid heavy lifting, wear low heel shoes, and practice good posture.  A sexual relationship may be continued unless your health care provider directs you otherwise. Relieving pain and discomfort  Wear a good support bra to prevent discomfort from breast tenderness.  Take warm sitz baths to soothe any pain or discomfort caused by hemorrhoids. Use hemorrhoid cream if your health care provider approves.  Rest with your legs elevated if you have leg cramps or low back pain.  If you develop varicose veins, wear support hose. Elevate your feet for 15 minutes, 3-4 times a day. Limit salt in your diet. Prenatal Care  Write down your questions. Take them to your prenatal visits.  Keep all your prenatal visits as told by your health care provider. This is important. Safety  Wear your seat belt at all times when driving.  Make a list of emergency phone numbers, including numbers for family, friends, the hospital, and police and fire departments. General instructions  Ask  your health care provider for a referral to a local prenatal education class. Begin classes no later than the beginning of month 6 of your pregnancy.  Ask for help if you have counseling or nutritional needs during pregnancy. Your health care provider can offer advice or refer you to specialists for help with various needs.  Do not use hot tubs, steam rooms, or saunas.  Do not douche or use tampons or scented sanitary pads.  Do not cross your legs for long periods of time.  Avoid cat litter boxes and soil used by cats. These carry germs that can cause birth defects in the baby and possibly loss of the fetus by miscarriage or stillbirth.  Avoid all smoking, herbs, alcohol, and unprescribed drugs. Chemicals in these products can affect the formation and growth of the baby.  Do not use any products that contain nicotine or tobacco, such as cigarettes and e-cigarettes. If you need help quitting, ask your health care provider.  Visit your dentist if you have not gone yet during your pregnancy. Use a soft toothbrush to brush your teeth and be gentle when you floss. Contact a health care provider if:  You have dizziness.  You have mild pelvic cramps, pelvic pressure, or nagging pain in the abdominal area.  You have persistent nausea, vomiting, or diarrhea.  You have a bad smelling vaginal discharge.  You have pain when you urinate. Get help right away if:  You have a fever.  You are leaking fluid from your vagina.  You have spotting or bleeding from your vagina.  You have severe abdominal cramping or pain.  You have rapid weight gain or weight loss.  You have shortness of breath with chest pain.  You notice sudden or extreme swelling of your face, hands, ankles, feet, or legs.  You have not felt your baby move in over an hour.  You have severe headaches that do not go away when you take medicine.  You have vision changes. Summary  The second trimester is from week 14  through week 27 (months 4 through 6). It is also a time when the fetus is growing rapidly.  Your body goes through many changes during pregnancy. The changes vary from woman to woman.  Avoid all smoking, herbs, alcohol, and unprescribed drugs. These chemicals affect the formation and growth your baby.  Do not use any tobacco products, such as cigarettes, chewing tobacco, and e-cigarettes. If you need help quitting, ask your health care provider.  Contact your health care provider if you have any questions. Keep all prenatal visits as told by your health care provider. This is important. This information is not intended to replace advice given to you by your health care provider. Make sure you discuss any questions you have with your health care provider. Document Released: 11/16/2001 Document Revised: 12/28/2016 Document Reviewed: 12/28/2016 Elsevier Interactive Patient Education  2019 [employer].  FREQUENTLY ASKED QUESTIONS FOR OBSTETRICS/PEDIATRICS    Q: Why are visitor restrictions different for maternity care areas?  [place] is restricting visitors for the duration of the patient's hospitalization. The birth of a child involves the mother, considered the patient, and a birthing partner. These are unprecedented times and we are making the exception to allow a birthing partner to be a part of the patient unit. No other guests will be allowed in our [place] at [facility] and at [facility].   Q: Are credentialed doulas allowed to support their existing patients?  We acknowledge the value these doula partnerships offer our care teams and many birthing families in our communities. Each laboring mother is allowed one birthing partner of the patient's choosing for her entire hospitalization.   Q: Are visitor restrictions different for  hospitalized children?  Pediatric patients (infants and children under 22 years of age), such as those in the Children's Unit, Pediatric ICU and NICU, will be allowed two visitors (parents or legal guardians)   Q: Are pregnant women at an increased risk for COVID-19?  The [employer] of Obstetricians and Gynecologists (ACOG) is monitoring closely the coronavirus pandemic. With the limited information available, data does not indicate pregnant women are at an increased risk. However, pregnant women are known to be at greater risk for respiratory infections like flu. With that in mind, expectant mothers are considered an at-risk population for COVID-19, according to ACOG.   Q: Are newborns at an increased risk for COVID-19?  A limited sample of COVID-19 data with newborns indicates the virus is not transferred to the infant during pregnancy. However, postpartum separation is recommended by the Centers for Disease Control (CDC). As a result [place] recommends and strongly encourages temporary separation of moms and babies who test positive for COVID-19 or are awaiting results to rule out COVID-19 based on CDC guidelines.   Q: If you have a suspected case of COVID-19, is the NICU couplet care room an option?  No. If either patient is considered at-risk for having COVID-19, the [place] [facility] will not use the NICU couplet care rooms for that family.   Q: [place] is urging that elective procedures be postponed. What is considered elective for women's and children's service line?  NOT ELECTIVE: Obstetric procedures, even those with an element of choice on timing, are not considered elective. Circumcisions are considered elective procedures, however, these do not deplete blood products and other resources, which is the spirit in which the COVID-19 postponement of elective procedures was intended. Therefore, circumcisions will be allowed.   ELECTIVE: Postpartum  tubal ligations are considered elective and should be postponed. Q&A for Obstetricians, Gynecologists and Pediatricians  Published February 23, 2019   [facility] supports as much as possible the medical care team working with the patient's individual needs to address timing during these unprecedented times. We seek the support of our medical care team in preserving needed resources throughout our crisis response to COVID-19.   Q: How does COVID-19 impact breastfeeding?  Breastmilk is safe for your baby - even if the mother has tested positive for COVID-19. If a COVID-19+ mother decides to breastfeed while inpatient and after discharge, we suggest proper protective equipment be worn and hand hygiene be performed before and after feeding the infant. The new mother also has the option to pump her milk and have a healthy family member feed the baby to protect the baby  from getting the virus.   Q: Should we urge patients to avoid baby showers and large gatherings?  Yes. As has been recommended for all citizens in our communities, gatherings of 10 or more should be avoided - pregnant or not. Seek creative options for "hosting" baby showers through electronic means that honor the request for social distancing during this time of heightened awareness.   Q: Should patients miss their prenatal appointments?  No. Prenatal visits are NOT elective. While we want to limit contact and exposure, prenatal care is vital right now. Contact your physician's office if you have concerns about your visits. We are limiting outpatient office visits to the patient and one guest in order to reduce the potential for exposure.   Q: What if a pregnant woman feels sick? Should she miss her prenatal visit then?  A pregnant woman experiencing coronavirus-like symptoms (i.e., cough, fever, difficulty breathing, shortness of breath, gastrointestinal issues) should contact her pregnancy care provider by phone. Her medical professional can  best determine whether she should use a video visit or possibly go to a collection site to be tested for COVID-19. Contacting her primary care provider or her pregnancy care provider is her first step.   Q: What can I do about childbirth education? All the classes are cancelled.  The [facility] & [place] will offer online learning to support mothers on their journey. We currently offer Understanding Childbirth, Understanding Breastfeeding and Understanding Newborn Care as an online class. Please visit our website, [email or web address], to register for an online class.   Q: How can I keep from getting COVID-19? Q&A for Obstetricians, Gynecologists and Pediatricians  Published February 23, 2019   Together, we can reduce the risk of exposure to the virus and help you and your family remain healthy and safe. One of the best ways to protect yourself is to wash your hands frequently using soap and water. Also, you should avoid touching your eyes, nose and mouth with unwashed hands, avoid physical contact with others and practice social distancing.   Q: How are employees being informed about what to do?  [place] leaders receive a daily COVID-19 update and share relevant information with their teams. This is a time when health care professionals are called on to lead within our community. We appreciate our staff's engagement with our COVID-19 updates and encourage them to share best practices on reducing the spread of the virus with our patients and community. We are prepared to provide the exceptional COVID-19 care and coordination our community needs, expects and deserves.   Q: Who's in charge of this issue at [facility]?  The leadership structure and process established to address COVID-19 includes Chief Physician Executive Phoebe Sharps, MD; Infection Prevention Medical Director Carlyle Basques, MD; and Infection Prevention Interim Director Hubert Azure, MSN, RN, CIC, CSPDT. A team of [facility] experts reflecting a broad spectrum of our workforce is meeting daily to evaluate new information we receive about [id number]-25 and to adapt policies and practices accordingly.                         Published February 23, 2019

## 2019-03-01 LAB — CBC WITH DIFFERENTIAL/PLATELET
Basophils Absolute: 0 10*3/uL (ref 0.0–0.2)
Basos: 0 %
EOS (ABSOLUTE): 0.1 10*3/uL (ref 0.0–0.4)
EOS: 1 %
HEMATOCRIT: 30.7 % — AB (ref 34.0–46.6)
Hemoglobin: 10.5 g/dL — ABNORMAL LOW (ref 11.1–15.9)
Immature Grans (Abs): 0 10*3/uL (ref 0.0–0.1)
Immature Granulocytes: 0 %
Lymphocytes Absolute: 1.2 10*3/uL (ref 0.7–3.1)
Lymphs: 26 %
MCH: 32.7 pg (ref 26.6–33.0)
MCHC: 34.2 g/dL (ref 31.5–35.7)
MCV: 96 fL (ref 79–97)
MONOS ABS: 0.5 10*3/uL (ref 0.1–0.9)
Monocytes: 10 %
Neutrophils Absolute: 2.9 10*3/uL (ref 1.4–7.0)
Neutrophils: 63 %
Platelets: 296 10*3/uL (ref 150–450)
RBC: 3.21 x10E6/uL — ABNORMAL LOW (ref 3.77–5.28)
RDW: 12.3 % (ref 11.7–15.4)
WBC: 4.5 10*3/uL (ref 3.4–10.8)

## 2019-03-01 LAB — VARICELLA ZOSTER ANTIBODY, IGG: Varicella zoster IgG: 1301 index (ref >165)

## 2019-03-01 LAB — URINALYSIS, ROUTINE W REFLEX MICROSCOPIC
Bilirubin, UA: NEGATIVE
GLUCOSE, UA: NEGATIVE
LEUKOCYTES UA: NEGATIVE
Nitrite, UA: NEGATIVE
RBC, UA: NEGATIVE
Urobilinogen, Ur: 1 mg/dL (ref 0.2–1.0)
pH, UA: 6 (ref 5.0–7.5)

## 2019-03-01 LAB — MICROSCOPIC EXAMINATION: Casts: NONE SEEN /lpf

## 2019-03-01 LAB — ABO AND RH: Rh Factor: POSITIVE

## 2019-03-01 LAB — HGB SOLU + RFLX FRAC: Sickle Solubility Test - HGBRFX: NEGATIVE

## 2019-03-01 LAB — RUBELLA SCREEN: Rubella Antibodies, IGG: 10.3 index (ref >0.99)

## 2019-03-01 LAB — ANTIBODY SCREEN: Antibody Screen: NEGATIVE

## 2019-03-01 LAB — RPR: RPR Ser Ql: NONREACTIVE

## 2019-03-01 LAB — TSH: TSH: 3.13 u[IU]/mL (ref 0.450–4.500)

## 2019-03-01 LAB — HEMOGLOBIN A1C
ESTIMATED AVERAGE GLUCOSE: 88 mg/dL
Hgb A1c MFr Bld: 4.7 % — ABNORMAL LOW (ref 4.8–5.6)

## 2019-03-01 LAB — HIV ANTIBODY (ROUTINE TESTING W REFLEX): HIV SCREEN 4TH GENERATION: NONREACTIVE

## 2019-03-01 LAB — HEPATITIS B SURFACE ANTIGEN: HEP B S AG: NEGATIVE

## 2019-03-02 LAB — URINE CULTURE

## 2019-03-02 LAB — GC/CHLAMYDIA PROBE AMP
Chlamydia trachomatis, NAA: NEGATIVE
NEISSERIA GONORRHOEAE BY PCR: NEGATIVE

## 2019-03-08 LAB — CYTOLOGY - PAP
Diagnosis: NEGATIVE
HPV (WINDOPATH): NOT DETECTED

## 2019-03-13 ENCOUNTER — Other Ambulatory Visit: Payer: Self-pay | Admitting: Obstetrics and Gynecology

## 2019-03-13 DIAGNOSIS — Z789 Other specified health status: Secondary | ICD-10-CM

## 2019-03-14 ENCOUNTER — Ambulatory Visit (INDEPENDENT_AMBULATORY_CARE_PROVIDER_SITE_OTHER): Payer: PRIVATE HEALTH INSURANCE

## 2019-03-14 ENCOUNTER — Other Ambulatory Visit: Payer: Self-pay

## 2019-03-14 DIAGNOSIS — Z363 Encounter for antenatal screening for malformations: Secondary | ICD-10-CM

## 2019-03-14 DIAGNOSIS — Z789 Other specified health status: Secondary | ICD-10-CM

## 2019-04-06 ENCOUNTER — Telehealth: Payer: Self-pay

## 2019-04-06 ENCOUNTER — Encounter: Payer: Self-pay | Admitting: *Deleted

## 2019-04-06 NOTE — Telephone Encounter (Signed)
Coronavirus (COVID-19) Are you at risk?  Are you at risk for the Coronavirus (COVID-19)?  To be considered HIGH RISK for Coronavirus (COVID-19), you have to meet the following criteria:  . Traveled to [place], [place], [place], [place] or [place]; or in the [place] to [place], [place], [place], or [place]; and have fever, cough, and shortness of breath within the last 2 weeks of travel OR . Been in close contact with a person diagnosed with COVID-19 within the last 2 weeks and have fever, cough, and shortness of breath . IF YOU DO NOT MEET THESE CRITERIA, YOU ARE CONSIDERED LOW RISK FOR COVID-19.  What to do if you are HIGH RISK for COVID-19?  . If you are having a medical emergency, call 911. . Seek medical care right away. Before you go to a doctor's office, urgent care or emergency department, call ahead and tell them about your recent travel, contact with someone diagnosed with COVID-19, and your symptoms. You should receive instructions from your physician's office regarding next steps of care.  . When you arrive at healthcare provider, tell the healthcare staff immediately you have returned from visiting [place], [place], [place], [place] or [place]; or traveled in the [place] to [place], [place], [place], or [place]; in the last two weeks or you have been in close contact with a person diagnosed with COVID-19 in the last 2 weeks.   . Tell the health care staff about your symptoms: fever, cough and shortness of breath. . After you have been seen by a medical provider, you will be either: o Tested for (COVID-19) and discharged home on quarantine except to seek medical care if symptoms worsen, and asked to  - Stay home and avoid contact with others until you get your results (4-5 days)  - Avoid travel on public transportation if possible (such as bus, train, or airplane) or o Sent to the Emergency Department by EMS for evaluation, COVID-19 testing, and possible  admission depending on your condition and test results.  What to do if you are LOW RISK for COVID-19?  Reduce your risk of any infection by using the same precautions used for avoiding the common cold or flu:  . Wash your hands often with soap and warm water for at least 20 seconds.  If soap and water are not readily available, use an alcohol-based hand sanitizer with at least 60% alcohol.  . If coughing or sneezing, cover your mouth and nose by coughing or sneezing into the elbow areas of your shirt or coat, into a tissue or into your sleeve (not your hands). . Avoid shaking hands with others and consider head nods or verbal greetings only. . Avoid touching your eyes, nose, or mouth with unwashed hands.  . Avoid close contact with people who are sick. . Avoid places or events with large numbers of people in one location, like concerts or sporting events. . Carefully consider travel plans you have or are making. . If you are planning any travel outside or inside the [place], visit the CDC's Travelers' Health webpage for the latest health notices. . If you have some symptoms but not all symptoms, continue to monitor at home and seek medical attention if your symptoms worsen. . If you are having a medical emergency, call 911.   ADDITIONAL HEALTHCARE OPTIONS FOR PATIENTS  [facility] / e-Visit: [email or web address]         [facility]: [phone]  [facility]: [phone]                   [facility]: [phone]   Prescreened. Neg .cm 

## 2019-04-09 ENCOUNTER — Other Ambulatory Visit: Payer: Self-pay

## 2019-04-09 ENCOUNTER — Ambulatory Visit (INDEPENDENT_AMBULATORY_CARE_PROVIDER_SITE_OTHER): Payer: Medicaid Other | Admitting: Certified Nurse Midwife

## 2019-04-09 VITALS — BP 103/71 | HR 71 | Wt 224.6 lb

## 2019-04-09 DIAGNOSIS — O0930 Supervision of pregnancy with insufficient antenatal care, unspecified trimester: Secondary | ICD-10-CM

## 2019-04-09 DIAGNOSIS — Z113 Encounter for screening for infections with a predominantly sexual mode of transmission: Secondary | ICD-10-CM

## 2019-04-09 DIAGNOSIS — Z131 Encounter for screening for diabetes mellitus: Secondary | ICD-10-CM

## 2019-04-09 DIAGNOSIS — Z130 Encounter for screening for diseases of the blood and blood-forming organs and certain disorders involving the immune mechanism: Secondary | ICD-10-CM

## 2019-04-09 DIAGNOSIS — Z3492 Encounter for supervision of normal pregnancy, unspecified, second trimester: Secondary | ICD-10-CM

## 2019-04-09 DIAGNOSIS — O0932 Supervision of pregnancy with insufficient antenatal care, second trimester: Secondary | ICD-10-CM

## 2019-04-09 DIAGNOSIS — O09522 Supervision of elderly multigravida, second trimester: Secondary | ICD-10-CM

## 2019-04-09 DIAGNOSIS — O09529 Supervision of elderly multigravida, unspecified trimester: Secondary | ICD-10-CM | POA: Insufficient documentation

## 2019-04-09 LAB — POCT URINALYSIS DIPSTICK OB
Bilirubin, UA: NEGATIVE
Blood, UA: NEGATIVE
Glucose, UA: NEGATIVE
Ketones, UA: NEGATIVE
Leukocytes, UA: NEGATIVE
Nitrite, UA: NEGATIVE
POC,PROTEIN,UA: NEGATIVE
Spec Grav, UA: 1.015 (ref 1.010–1.025)
Urobilinogen, UA: 1 E.U./dL
pH, UA: 6.5 (ref 5.0–8.0)

## 2019-04-09 NOTE — Patient Instructions (Signed)

## 2019-04-09 NOTE — Progress Notes (Signed)
ROB-Doing well. Questions answered related to vaccines and midwifery care. Education regarding COVID restrictions and precautions. Naming daughter Jewel. Desires epidural. Plans breastfeeding. Anticipatory guidance regarding course of prenatal care. Reviewed red flag symptoms and when to call. RTC x 4-5 weeks for 28 wk labs and ROB or sooner if needed.

## 2019-04-12 ENCOUNTER — Encounter: Payer: PRIVATE HEALTH INSURANCE | Admitting: Certified Nurse Midwife

## 2019-05-07 ENCOUNTER — Telehealth: Payer: Self-pay

## 2019-05-08 ENCOUNTER — Encounter: Payer: Self-pay | Admitting: Certified Nurse Midwife

## 2019-05-08 ENCOUNTER — Ambulatory Visit (INDEPENDENT_AMBULATORY_CARE_PROVIDER_SITE_OTHER): Payer: PRIVATE HEALTH INSURANCE | Admitting: Certified Nurse Midwife

## 2019-05-08 ENCOUNTER — Other Ambulatory Visit: Payer: Self-pay

## 2019-05-08 ENCOUNTER — Other Ambulatory Visit: Payer: PRIVATE HEALTH INSURANCE

## 2019-05-08 VITALS — BP 100/73 | HR 79 | Wt 232.4 lb

## 2019-05-08 DIAGNOSIS — Z3492 Encounter for supervision of normal pregnancy, unspecified, second trimester: Secondary | ICD-10-CM

## 2019-05-08 DIAGNOSIS — Z131 Encounter for screening for diabetes mellitus: Secondary | ICD-10-CM

## 2019-05-08 DIAGNOSIS — Z130 Encounter for screening for diseases of the blood and blood-forming organs and certain disorders involving the immune mechanism: Secondary | ICD-10-CM

## 2019-05-08 DIAGNOSIS — Z113 Encounter for screening for infections with a predominantly sexual mode of transmission: Secondary | ICD-10-CM

## 2019-05-08 DIAGNOSIS — O09529 Supervision of elderly multigravida, unspecified trimester: Secondary | ICD-10-CM

## 2019-05-08 DIAGNOSIS — Z23 Encounter for immunization: Secondary | ICD-10-CM | POA: Diagnosis not present

## 2019-05-08 LAB — POCT URINALYSIS DIPSTICK OB
Bilirubin, UA: NEGATIVE
Blood, UA: NEGATIVE
Glucose, UA: NEGATIVE
Ketones, UA: NEGATIVE
Leukocytes, UA: NEGATIVE
Nitrite, UA: NEGATIVE
POC,PROTEIN,UA: NEGATIVE
Spec Grav, UA: 1.02 (ref 1.010–1.025)
Urobilinogen, UA: 0.2 E.U./dL
pH, UA: 7 (ref 5.0–8.0)

## 2019-05-08 MED ORDER — TETANUS-DIPHTH-ACELL PERTUSSIS 5-2.5-18.5 LF-MCG/0.5 IM SUSP
0.5000 mL | Freq: Once | INTRAMUSCULAR | Status: AC
  Administered 2019-05-08: 0.5 mL via INTRAMUSCULAR

## 2019-05-08 NOTE — Progress Notes (Signed)
ROB doing well. Feel good movement. Glucose/Tdap/BTC/CBC/RPR today. Discussed BC after baby. She is considering postpartum tubal but is not sure yet. Booklet given on [facility] options. Also discussed birth plan, sample plan given. Will follow up with lab results. Return 2 wks.   Doreene Burke, CNM

## 2019-05-08 NOTE — Patient Instructions (Signed)
Td Vaccine (Tetanus and Diphtheria): What You Need to Know °1. Why get vaccinated? °Tetanus  and diphtheria are very serious diseases. They are rare in the [place] today, but people who do become infected often have severe complications. Td vaccine is used to protect adolescents and adults from both of these diseases. °Both tetanus and diphtheria are infections caused by bacteria. Diphtheria spreads from person to person through coughing or sneezing. Tetanus-causing bacteria enter the body through cuts, scratches, or wounds. °TETANUS (Lockjaw) causes painful muscle tightening and stiffness, usually all over the body. °· It can lead to tightening of muscles in the head and neck so you can't open your mouth, swallow, or sometimes even breathe. Tetanus kills about 1 out of every 10 people who are infected even after receiving the best medical care. °DIPHTHERIA can cause a thick coating to form in the back of the throat. °· It can lead to breathing problems, paralysis, heart failure, and death. °Before vaccines, as many as 200,000 cases of diphtheria and hundreds of cases of tetanus were reported in the [place] each year. Since vaccination began, reports of cases for both diseases have dropped by about 99%. °2. Td vaccine °Td vaccine can protect adolescents and adults from tetanus and diphtheria. Td is usually given as a booster dose every 10 years but it can also be given earlier after a severe and dirty wound or burn. °Another vaccine, called Tdap, which protects against pertussis in addition to tetanus and diphtheria, is sometimes recommended instead of Td vaccine. °Your doctor or the person giving you the vaccine can give you more information. °Td may safely be given at the same time as other vaccines. °3. Some people should not get this vaccine °· A person who has ever had a life-threatening allergic reaction after a previous dose of any tetanus or diphtheria containing vaccine, OR has a severe allergy  to any part of this vaccine, should not get Td vaccine. Tell the person giving the vaccine about any severe allergies. °· Talk to your doctor if you: °? had severe pain or swelling after any vaccine containing diphtheria or tetanus, °? ever had a condition called Guillain Barré Syndrome (GBS), °? aren't feeling well on the day the shot is scheduled. °4. Risks of a vaccine reaction °With any medicine, including vaccines, there is a chance of side effects. These are usually mild and go away on their own. Serious reactions are also possible but are rare. °Most people who get Td vaccine do not have any problems with it. °Mild Problems following Td vaccine: °(Did not interfere with activities) °· Pain where the shot was given (about 8 people in 10) °· Redness or swelling where the shot was given (about 1 person in 4) °· Mild fever (rare) °· Headache (about 1 person in 4) °· Tiredness (about 1 person in 4) °Moderate Problems following Td vaccine: °(Interfered with activities, but did not require medical attention) °· Fever over 102°F (rare) °Severe Problems following Td vaccine: °(Unable to perform usual activities; required medical attention) °· Swelling, severe pain, bleeding and/or redness in the arm where the shot was given (rare). °Problems that could happen after any vaccine: °· People sometimes faint after a medical procedure, including vaccination. Sitting or lying down for about 15 minutes can help prevent fainting, and injuries caused by a fall. Tell your doctor if you feel dizzy, or have vision changes or ringing in the ears. °· Some people get severe pain in the shoulder and have   difficulty moving the arm where a shot was given. This happens very rarely. °· Any medication can cause a severe allergic reaction. Such reactions from a vaccine are very rare, estimated at fewer than 1 in a million doses, and would happen within a few minutes to a few hours after the vaccination. °As with any medicine, there is a  very remote chance of a vaccine causing a serious injury or death. °The safety of vaccines is always being monitored. For more information, visit: www.cdc.gov/vaccinesafety/ °5. What if there is a serious reaction? °What should I look for? °· Look for anything that concerns you, such as signs of a severe allergic reaction, very high fever, or unusual behavior. °Signs of a severe allergic reaction can include hives, swelling of the face and throat, difficulty breathing, a fast heartbeat, dizziness, and weakness. These would usually start a few minutes to a few hours after the vaccination. °What should I do? °· If you think it is a severe allergic reaction or other emergency that can't wait, call 9-1-1 or get the person to the nearest hospital. Otherwise, call your doctor. °· Afterward, the reaction should be reported to the Vaccine Adverse Event Reporting System (VAERS). Your doctor might file this report, or you can do it yourself through the VAERS web site at www.vaers.hhs.gov, or by calling 1-800-822-7967. °VAERS does not give medical advice. °6. The National Vaccine Injury Compensation Program °The National Vaccine Injury Compensation Program (VICP) is a federal program that was created to compensate people who may have been injured by certain vaccines. °Persons who believe they may have been injured by a vaccine can learn about the program and about filing a claim by calling 1-800-338-2382 or visiting the VICP website at www.hrsa.gov/vaccinecompensation. There is a time limit to file a claim for compensation. °7. How can I learn more? °· Ask your doctor. He or she can give you the vaccine package insert or suggest other sources of information. °· Call your local or state health department. °· Contact the Centers for Disease Control and Prevention (CDC): °? Call 1-800-232-4636 (1-800-CDC-INFO) °? Visit CDC's website at www.cdc.gov/vaccines °Vaccine Information Statement Td Vaccine (03/16/16) °This information is  not intended to replace advice given to you by your health care provider. Make sure you discuss any questions you have with your health care provider. °Document Released: 09/19/2006 Document Revised: 07/10/2018 Document Reviewed: 07/10/2018 °Elsevier Interactive Patient Education © 2019 Elsevier Inc[employer] °Glucose Tolerance Test During Pregnancy °Why am I having this test? °The glucose tolerance test (GTT) is done to check how your body processes sugar (glucose). This is one of several tests used to diagnose diabetes that develops during pregnancy (gestational diabetes mellitus). Gestational diabetes is a temporary form of diabetes that some women develop during pregnancy. It usually occurs during the second trimester of pregnancy and goes away after delivery. °Testing (screening) for gestational diabetes usually occurs between 24 and 28 weeks of pregnancy. You may have the GTT test after having a 1-hour glucose screening test if the results from that test indicate that you may have gestational diabetes. You may also have this test if: °· You have a history of gestational diabetes. °· You have a history of giving birth to very large babies or have experienced repeated fetal loss (stillbirth). °· You have signs and symptoms of diabetes, such as: °? Changes in your vision. °? Tingling or numbness in your hands or feet. °? Changes in hunger, thirst, and urination that are not otherwise explained by your pregnancy. °  What is being tested? °This test measures the amount of glucose in your blood at different times during a period of 3 hours. This indicates how well your body is able to process glucose. °What kind of sample is taken? ° °Blood samples are required for this test. They are usually collected by inserting a needle into a blood vessel. °How do I prepare for this test? °· For 3 days before your test, eat normally. Have plenty of carbohydrate-rich foods. °· Follow instructions from your health care provider  about: °? Eating or drinking restrictions on the day of the test. You may be asked to not eat or drink anything other than water (fast) starting 8-10 hours before the test. °? Changing or stopping your regular medicines. Some medicines may interfere with this test. °Tell a health care provider about: °· All medicines you are taking, including vitamins, herbs, eye drops, creams, and over-the-counter medicines. °· Any blood disorders you have. °· Any surgeries you have had. °· Any medical conditions you have. °What happens during the test? °First, your blood glucose will be measured. This is referred to as your fasting blood glucose, since you fasted before the test. Then, you will drink a glucose solution that contains a certain amount of glucose. Your blood glucose will be measured again 1, 2, and 3 hours after drinking the solution. °This test takes about 3 hours to complete. You will need to stay at the testing location during this time. During the testing period: °· Do not eat or drink anything other than the glucose solution. °· Do not exercise. °· Do not use any products that contain nicotine or tobacco, such as cigarettes and e-cigarettes. If you need help stopping, ask your health care provider. °The testing procedure may vary among health care providers and hospitals. °How are the results reported? °Your results will be reported as milligrams of glucose per deciliter of blood (mg/dL) or millimoles per liter (mmol/L). Your health care provider will compare your results to normal ranges that were established after testing a large group of people (reference ranges). Reference ranges may vary among labs and hospitals. For this test, common reference ranges are: °· Fasting: less than 95-105 mg/dL (5.3-5.8 mmol/L). °· 1 hour after drinking glucose: less than 180-190 mg/dL (10.0-10.5 mmol/L). °· 2 hours after drinking glucose: less than 155-165 mg/dL (8.6-9.2 mmol/L). °· 3 hours after drinking glucose: 140-145  mg/dL (7.8-8.1 mmol/L). °What do the results mean? °Results within reference ranges are considered normal, meaning that your glucose levels are well-controlled. If two or more of your blood glucose levels are high, you may be diagnosed with gestational diabetes. If only one level is high, your health care provider may suggest repeat testing or other tests to confirm a diagnosis. °Talk with your health care provider about what your results mean. °Questions to ask your health care provider °Ask your health care provider, or the department that is doing the test: °· When will my results be ready? °· How will I get my results? °· What are my treatment options? °· What other tests do I need? °· What are my next steps? °Summary °· The glucose tolerance test (GTT) is one of several tests used to diagnose diabetes that develops during pregnancy (gestational diabetes mellitus). Gestational diabetes is a temporary form of diabetes that some women develop during pregnancy. °· You may have the GTT test after having a 1-hour glucose screening test if the results from that test indicate that you may have gestational diabetes.   You may also have this test if you have any symptoms or risk factors for gestational diabetes. °· Talk with your health care provider about what your results mean. °This information is not intended to replace advice given to you by your health care provider. Make sure you discuss any questions you have with your health care provider. °Document Released: 05/23/2012 Document Revised: 07/04/2017 Document Reviewed: 07/04/2017 °Elsevier Interactive Patient Education © 2019 Elsevier Inc. ° °

## 2019-05-09 ENCOUNTER — Encounter: Payer: Self-pay | Admitting: Certified Nurse Midwife

## 2019-05-09 DIAGNOSIS — O99019 Anemia complicating pregnancy, unspecified trimester: Secondary | ICD-10-CM | POA: Insufficient documentation

## 2019-05-09 LAB — CBC
Hematocrit: 30.8 % — ABNORMAL LOW (ref 34.0–46.6)
Hemoglobin: 10.7 g/dL — ABNORMAL LOW (ref 11.1–15.9)
MCH: 32.9 pg (ref 26.6–33.0)
MCHC: 34.7 g/dL (ref 31.5–35.7)
MCV: 95 fL (ref 79–97)
Platelets: 302 10*3/uL (ref 150–450)
RBC: 3.25 x10E6/uL — ABNORMAL LOW (ref 3.77–5.28)
RDW: 11.9 % (ref 11.7–15.4)
WBC: 5.6 10*3/uL (ref 3.4–10.8)

## 2019-05-09 LAB — RPR: RPR Ser Ql: NONREACTIVE

## 2019-05-09 LAB — GLUCOSE, 1 HOUR GESTATIONAL: Gestational Diabetes Screen: 64 mg/dL — ABNORMAL LOW (ref 65–139)

## 2019-05-16 NOTE — Telephone Encounter (Signed)
error 

## 2019-05-22 ENCOUNTER — Ambulatory Visit (INDEPENDENT_AMBULATORY_CARE_PROVIDER_SITE_OTHER): Payer: PRIVATE HEALTH INSURANCE | Admitting: Obstetrics and Gynecology

## 2019-05-22 ENCOUNTER — Other Ambulatory Visit: Payer: Self-pay

## 2019-05-22 VITALS — BP 114/74 | HR 95 | Wt 235.3 lb

## 2019-05-22 DIAGNOSIS — Z3493 Encounter for supervision of normal pregnancy, unspecified, third trimester: Secondary | ICD-10-CM

## 2019-05-22 LAB — POCT URINALYSIS DIPSTICK OB
Bilirubin, UA: NEGATIVE
Blood, UA: NEGATIVE
Glucose, UA: NEGATIVE
Ketones, UA: NEGATIVE
Leukocytes, UA: NEGATIVE
Nitrite, UA: NEGATIVE
Spec Grav, UA: 1.015
Urobilinogen, UA: 0.2 U/dL
pH, UA: 7

## 2019-05-22 NOTE — Patient Instructions (Signed)
Back Pain in Pregnancy  Back pain during pregnancy is common. Back pain may be caused by several factors that are related to changes during your pregnancy.  Follow these instructions at home:  Managing pain, stiffness, and swelling          If directed, for sudden (acute) back pain, put ice on the painful area.  ? Put ice in a plastic bag.  ? Place a towel between your skin and the bag.  ? Leave the ice on for 20 minutes, 2-3 times per day.   If directed, apply heat to the affected area before you exercise. Use the heat source that your health care provider recommends, such as a moist heat pack or a heating pad.  ? Place a towel between your skin and the heat source.  ? Leave the heat on for 20-30 minutes.  ? Remove the heat if your skin turns bright red. This is especially important if you are unable to feel pain, heat, or cold. You may have a greater risk of getting burned.   If directed, massage the affected area.  Activity   Exercise as told by your health care provider. Gentle exercise is the best way to prevent or manage back pain.   Listen to your body when lifting. If lifting hurts, ask for help or bend your knees. This uses your leg muscles instead of your back muscles.   Squat down when picking up something from the floor. Do not bend over.   Only use bed rest for short periods as told by your health care provider. Bed rest should only be used for the most severe episodes of back pain.  Standing, sitting, and lying down   Do not stand in one place for long periods of time.   Use good posture when sitting. Make sure your head rests over your shoulders and is not hanging forward. Use a pillow on your lower back if necessary.   Try sleeping on your side, preferably the left side, with a pregnancy support pillow or 1-2 regular pillows between your legs.  ? If you have back pain after a night's rest, your bed may be too soft.  ? A firm mattress may provide more support for your back during  pregnancy.  General instructions   Do not wear high heels.   Eat a healthy diet. Try to gain weight within your health care provider's recommendations.   Use a maternity girdle, elastic sling, or back brace as told by your health care provider.   Take over-the-counter and prescription medicines only as told by your health care provider.   Work with a physical therapist or massage therapist to find ways to manage back pain. Acupuncture or massage therapy may be helpful.   Keep all follow-up visits as told by your health care provider. This is important.  Contact a health care provider if:   Your back pain interferes with your daily activities.   You have increasing pain in other parts of your body.  Get help right away if:   You develop numbness, tingling, weakness, or problems with the use of your arms or legs.   You develop severe back pain that is not controlled with medicine.   You have a change in bowel or bladder control.   You develop shortness of breath, dizziness, or you faint.   You develop nausea, vomiting, or sweating.   You have back pain that is a rhythmic, cramping pain similar to labor pains. Labor   pain is usually 1-2 minutes apart, lasts for about 1 minute, and involves a bearing down feeling or pressure in your pelvis.   You have back pain and your water breaks or you have vaginal bleeding.   You have back pain or numbness that travels down your leg.   Your back pain developed after you fell.   You develop pain on one side of your back.   You see blood in your urine.   You develop skin blisters in the area of your back pain.  Summary   Back pain may be caused by several factors that are related to changes during your pregnancy.   Follow instructions as told by your health care provider for managing pain, stiffness, and swelling.   Exercise as told by your health care provider. Gentle exercise is the best way to prevent or manage back pain.   Take over-the-counter and  prescription medicines only as told by your health care provider.   Keep all follow-up visits as told by your health care provider. This is important.  This information is not intended to replace advice given to you by your health care provider. Make sure you discuss any questions you have with your health care provider.  Document Released: 03/02/2006 Document Revised: 05/10/2018 Document Reviewed: 05/10/2018  Elsevier Interactive Patient Education  2019 [employer] Inc.

## 2019-05-22 NOTE — Progress Notes (Signed)
ROB- pt c/o low back pain, otherwise doing well

## 2019-05-22 NOTE — Progress Notes (Signed)
ROB- doing well except back pain.tylenol PM

## 2019-06-01 ENCOUNTER — Telehealth: Payer: Self-pay | Admitting: *Deleted

## 2019-06-01 NOTE — Telephone Encounter (Signed)
Coronavirus (COVID-19) Are you at risk?  Are you at risk for the Coronavirus (COVID-19)?  To be considered HIGH RISK for Coronavirus (COVID-19), you have to meet the following criteria:  . Traveled to [place], [place], [place], [place] or [place]; or in the [place] to [place], [place], [place], or [place]; and have fever, cough, and shortness of breath within the last 2 weeks of travel OR . Been in close contact with a person diagnosed with COVID-19 within the last 2 weeks and have fever, cough, and shortness of breath . IF YOU DO NOT MEET THESE CRITERIA, YOU ARE CONSIDERED LOW RISK FOR COVID-19.  What to do if you are HIGH RISK for COVID-19?  . If you are having a medical emergency, call 911. . Seek medical care right away. Before you go to a doctor's office, urgent care or emergency department, call ahead and tell them about your recent travel, contact with someone diagnosed with COVID-19, and your symptoms. You should receive instructions from your physician's office regarding next steps of care.  . When you arrive at healthcare provider, tell the healthcare staff immediately you have returned from visiting [place], [place], [place], [place] or [place]; or traveled in the [place] to [place], [place], [place], or [place]; in the last two weeks or you have been in close contact with a person diagnosed with COVID-19 in the last 2 weeks.   . Tell the health care staff about your symptoms: fever, cough and shortness of breath. . After you have been seen by a medical provider, you will be either: o Tested for (COVID-19) and discharged home on quarantine except to seek medical care if symptoms worsen, and asked to  - Stay home and avoid contact with others until you get your results (4-5 days)  - Avoid travel on public transportation if possible (such as bus, train, or airplane) or o Sent to the Emergency Department by EMS for evaluation, COVID-19 testing, and possible  admission depending on your condition and test results.  What to do if you are LOW RISK for COVID-19?  Reduce your risk of any infection by using the same precautions used for avoiding the common cold or flu:  . Wash your hands often with soap and warm water for at least 20 seconds.  If soap and water are not readily available, use an alcohol-based hand sanitizer with at least 60% alcohol.  . If coughing or sneezing, cover your mouth and nose by coughing or sneezing into the elbow areas of your shirt or coat, into a tissue or into your sleeve (not your hands). . Avoid shaking hands with others and consider head nods or verbal greetings only. . Avoid touching your eyes, nose, or mouth with unwashed hands.  . Avoid close contact with people who are sick. . Avoid places or events with large numbers of people in one location, like concerts or sporting events. . Carefully consider travel plans you have or are making. . If you are planning any travel outside or inside the [place], visit the CDC's Travelers' Health webpage for the latest health notices. . If you have some symptoms but not all symptoms, continue to monitor at home and seek medical attention if your symptoms worsen. . If you are having a medical emergency, call 911.   ADDITIONAL HEALTHCARE OPTIONS FOR PATIENTS  [facility] / e-Visit: [email or web address]         [facility]: [phone]  [facility]: [phone]                   [facility]: [phone]   Spoke with pt denies any sx.  Langford Carias, CMA 

## 2019-06-04 ENCOUNTER — Other Ambulatory Visit: Payer: Self-pay

## 2019-06-04 ENCOUNTER — Ambulatory Visit (INDEPENDENT_AMBULATORY_CARE_PROVIDER_SITE_OTHER): Payer: PRIVATE HEALTH INSURANCE | Admitting: Certified Nurse Midwife

## 2019-06-04 VITALS — BP 99/66 | HR 72 | Wt 241.1 lb

## 2019-06-04 DIAGNOSIS — Z3493 Encounter for supervision of normal pregnancy, unspecified, third trimester: Secondary | ICD-10-CM

## 2019-06-04 LAB — POCT URINALYSIS DIPSTICK OB
Bilirubin, UA: NEGATIVE
Blood, UA: NEGATIVE
Glucose, UA: NEGATIVE
Ketones, UA: NEGATIVE
Leukocytes, UA: NEGATIVE
Nitrite, UA: NEGATIVE
POC,PROTEIN,UA: NEGATIVE
Spec Grav, UA: 1.02 (ref 1.010–1.025)
Urobilinogen, UA: 0.2 E.U./dL
pH, UA: 6.5 (ref 5.0–8.0)

## 2019-06-04 NOTE — Patient Instructions (Signed)

## 2019-06-04 NOTE — Progress Notes (Signed)
ROB-Reports low back pain, relieved by Tylenol. Discussed home treatment measures including Spinning Babies and use of abdominal support. Anticipatory guidance regarding course of prenatal care. Reviewed red flag symptoms and when to call. RTC x 2 wks for ROB or sooner if needed.

## 2019-06-04 NOTE — Progress Notes (Signed)
ROB-Patient c/o continued lower right back pain, taking Tylenol with relief.

## 2019-06-07 ENCOUNTER — Encounter: Payer: PRIVATE HEALTH INSURANCE | Admitting: Certified Nurse Midwife

## 2019-06-15 ENCOUNTER — Telehealth: Payer: Self-pay

## 2019-06-15 NOTE — Telephone Encounter (Signed)
Coronavirus (COVID-19) Are you at risk?  Are you at risk for the Coronavirus (COVID-19)?  To be considered HIGH RISK for Coronavirus (COVID-19), you have to meet the following criteria:  . Traveled to [place], [place], [place], [place] or [place]; or in the [place] to [place], [place], [place], or [place]; and have fever, cough, and shortness of breath within the last 2 weeks of travel OR . Been in close contact with a person diagnosed with COVID-19 within the last 2 weeks and have fever, cough, and shortness of breath . IF YOU DO NOT MEET THESE CRITERIA, YOU ARE CONSIDERED LOW RISK FOR COVID-19.  What to do if you are HIGH RISK for COVID-19?  . If you are having a medical emergency, call 911. . Seek medical care right away. Before you go to a doctor's office, urgent care or emergency department, call ahead and tell them about your recent travel, contact with someone diagnosed with COVID-19, and your symptoms. You should receive instructions from your physician's office regarding next steps of care.  . When you arrive at healthcare provider, tell the healthcare staff immediately you have returned from visiting [place], [place], [place], [place] or [place]; or traveled in the [place] to [place], [place], [place], or [place]; in the last two weeks or you have been in close contact with a person diagnosed with COVID-19 in the last 2 weeks.   . Tell the health care staff about your symptoms: fever, cough and shortness of breath. . After you have been seen by a medical provider, you will be either: o Tested for (COVID-19) and discharged home on quarantine except to seek medical care if symptoms worsen, and asked to  - Stay home and avoid contact with others until you get your results (4-5 days)  - Avoid travel on public transportation if possible (such as bus, train, or airplane) or o Sent to the Emergency Department by EMS for evaluation, COVID-19 testing, and possible  admission depending on your condition and test results.  What to do if you are LOW RISK for COVID-19?  Reduce your risk of any infection by using the same precautions used for avoiding the common cold or flu:  . Wash your hands often with soap and warm water for at least 20 seconds.  If soap and water are not readily available, use an alcohol-based hand sanitizer with at least 60% alcohol.  . If coughing or sneezing, cover your mouth and nose by coughing or sneezing into the elbow areas of your shirt or coat, into a tissue or into your sleeve (not your hands). . Avoid shaking hands with others and consider head nods or verbal greetings only. . Avoid touching your eyes, nose, or mouth with unwashed hands.  . Avoid close contact with people who are [place]. . Avoid places or events with large numbers of people in one location, like concerts or sporting events. . Carefully consider travel plans you have or are making. . If you are planning any travel outside or inside the [place], visit the CDC's Travelers' Health webpage for the latest health notices. . If you have some symptoms but not all symptoms, continue to monitor at home and seek medical attention if your symptoms worsen. . If you are having a medical emergency, call 911.  06/15/19 SCREENING NEG SLS ADDITIONAL HEALTHCARE OPTIONS FOR PATIENTS  [facility] / e-Visit: [email or web address]         [facility]: [phone]    [facility]: [phone]                   [facility]: [phone]  

## 2019-06-18 ENCOUNTER — Other Ambulatory Visit: Payer: Self-pay

## 2019-06-18 ENCOUNTER — Ambulatory Visit (INDEPENDENT_AMBULATORY_CARE_PROVIDER_SITE_OTHER): Payer: PRIVATE HEALTH INSURANCE | Admitting: Certified Nurse Midwife

## 2019-06-18 ENCOUNTER — Encounter: Payer: Self-pay | Admitting: Certified Nurse Midwife

## 2019-06-18 VITALS — BP 115/82 | HR 86 | Wt 242.4 lb

## 2019-06-18 DIAGNOSIS — Z3493 Encounter for supervision of normal pregnancy, unspecified, third trimester: Secondary | ICD-10-CM

## 2019-06-18 LAB — POCT URINALYSIS DIPSTICK OB
Bilirubin, UA: NEGATIVE
Blood, UA: NEGATIVE
Glucose, UA: NEGATIVE
Ketones, UA: NEGATIVE
Leukocytes, UA: NEGATIVE
Nitrite, UA: NEGATIVE
POC,PROTEIN,UA: NEGATIVE
Spec Grav, UA: 1.01 (ref 1.010–1.025)
Urobilinogen, UA: 0.2 E.U./dL
pH, UA: 7.5 (ref 5.0–8.0)

## 2019-06-18 NOTE — Progress Notes (Signed)
ROB doing well. Feels good movement. GBS and culture at next visit reviewed. BC discussed. PT is unsure was thinking about tubal but not positive. Does not want to bleeding She had implanon in past and had heavy bleeding. Discussed Mirena IUD and bleeding profile. Pamphlet given. Follow up 2 wks for ROB.   Philip Aspen, CNM

## 2019-06-18 NOTE — Patient Instructions (Signed)
[facility]  [facility]  [address], [place], [place] [address]  Phone: [phone]  [facility] (second location)  [address] [place], [place] [address]  Phone: [phone]  [facility] ([place]) [address], [place], [place] [address] Phone: [phone]  [facility]  [address] ,  [address]  Phone: [phone] 

## 2019-07-02 ENCOUNTER — Ambulatory Visit (INDEPENDENT_AMBULATORY_CARE_PROVIDER_SITE_OTHER): Payer: PRIVATE HEALTH INSURANCE | Admitting: Certified Nurse Midwife

## 2019-07-02 ENCOUNTER — Telehealth: Payer: Self-pay | Admitting: Lactation Services

## 2019-07-02 ENCOUNTER — Other Ambulatory Visit: Payer: Self-pay

## 2019-07-02 VITALS — BP 115/77 | HR 72 | Wt 245.4 lb

## 2019-07-02 DIAGNOSIS — Z3685 Encounter for antenatal screening for Streptococcus B: Secondary | ICD-10-CM

## 2019-07-02 DIAGNOSIS — Z3493 Encounter for supervision of normal pregnancy, unspecified, third trimester: Secondary | ICD-10-CM

## 2019-07-02 DIAGNOSIS — Z113 Encounter for screening for infections with a predominantly sexual mode of transmission: Secondary | ICD-10-CM

## 2019-07-02 LAB — POCT URINALYSIS DIPSTICK OB
Bilirubin, UA: NEGATIVE
Blood, UA: NEGATIVE
Glucose, UA: NEGATIVE
Ketones, UA: NEGATIVE
Leukocytes, UA: NEGATIVE
Nitrite, UA: NEGATIVE
POC,PROTEIN,UA: NEGATIVE
Spec Grav, UA: 1.02 (ref 1.010–1.025)
Urobilinogen, UA: 0.2 E.U./dL
pH, UA: 6.5 (ref 5.0–8.0)

## 2019-07-02 LAB — OB RESULTS CONSOLE GC/CHLAMYDIA: Gonorrhea: NEGATIVE

## 2019-07-02 NOTE — Telephone Encounter (Signed)
Patient's provider called Lactation for breastfeeding referral. Spoke with patient about her previous breastfeeding experience 13 years ago. Patient explained that her previous experience lasted 4 weeks due to painful and cracked/bleeding nipples. She has researched breastfeeding and feels that it is the best for her baby, but she is concerned with being successful. She has attended the breastfeeding education class through [facility] in May of 2020 (virtually).  Discussed with patient the lactation support available through the hospital while in-patient- helping with education on positioning, latch, and breastfeeding education.  Provided patient with reassurance for ongoing support through community resources available after discharge, out-patient options with [facility] through the hospital, and breastfeeding support groups.  Encouraged patient to call with any additional questions or concerns.

## 2019-07-02 NOTE — Patient Instructions (Signed)
Vaginal Delivery  Vaginal delivery means that you give birth by pushing your baby out of your birth canal (vagina). A team of health care providers will help you before, during, and after vaginal delivery. Birth experiences are unique for every woman and every pregnancy, and birth experiences vary depending on where you choose to give birth. What happens when I arrive at the birth center or hospital? Once you are in labor and have been admitted into the hospital or birth center, your health care provider may:  Review your pregnancy history and any concerns that you have.  Insert an IV into one of your veins. This may be used to give you fluids and medicines.  Check your blood pressure, pulse, temperature, and heart rate (vital signs).  Check whether your bag of water (amniotic sac) has broken (ruptured).  Talk with you about your birth plan and discuss pain control options. Monitoring Your health care provider may monitor your contractions (uterine monitoring) and your baby's heart rate (fetal monitoring). You may need to be monitored:  Often, but not continuously (intermittently).  All the time or for long periods at a time (continuously). Continuous monitoring may be needed if: ? You are taking certain medicines, such as medicine to relieve pain or make your contractions stronger. ? You have pregnancy or labor complications. Monitoring may be done by:  Placing a special stethoscope or a handheld monitoring device on your abdomen to check your baby's heartbeat and to check for contractions.  Placing monitors on your abdomen (external monitors) to record your baby's heartbeat and the frequency and length of contractions.  Placing monitors inside your uterus through your vagina (internal monitors) to record your baby's heartbeat and the frequency, length, and strength of your contractions. Depending on the type of monitor, it may remain in your uterus or on your baby's head until birth.   Telemetry. This is a type of continuous monitoring that can be done with external or internal monitors. Instead of having to stay in bed, you are able to move around during telemetry. Physical exam Your health care provider may perform frequent physical exams. This may include:  Checking how and where your baby is positioned in your uterus.  Checking your cervix to determine: ? Whether it is thinning out (effacing). ? Whether it is opening up (dilating). What happens during labor and delivery?  Normal labor and delivery is divided into the following three stages: Stage 1  This is the longest stage of labor.  This stage can last for hours or days.  Throughout this stage, you will feel contractions. Contractions generally feel mild, infrequent, and irregular at first. They get stronger, more frequent (about every 2-3 minutes), and more regular as you move through this stage.  This stage ends when your cervix is completely dilated to 4 inches (10 cm) and completely effaced. Stage 2  This stage starts once your cervix is completely effaced and dilated and lasts until the delivery of your baby.  This stage may last from 20 minutes to 2 hours.  This is the stage where you will feel an urge to push your baby out of your vagina.  You may feel stretching and burning pain, especially when the widest part of your baby's head passes through the vaginal opening (crowning).  Once your baby is delivered, the umbilical cord will be clamped and cut. This usually occurs after waiting a period of 1-2 minutes after delivery.  Your baby will be placed on your bare chest (  skin-to-skin contact) in an upright position and covered with a warm blanket. Watch your baby for feeding cues, like rooting or sucking, and help the baby to your breast for his or her first feeding. Stage 3  This stage starts immediately after the birth of your baby and ends after you deliver the placenta.  This stage may take  anywhere from 5 to 30 minutes.  After your baby has been delivered, you will feel contractions as your body expels the placenta and your uterus contracts to control bleeding. What can I expect after labor and delivery?  After labor is over, you and your baby will be monitored closely until you are ready to go home to ensure that you are both healthy. Your health care team will teach you how to care for yourself and your baby.  You and your baby will stay in the same room (rooming in) during your hospital stay. This will encourage early bonding and successful breastfeeding.  You may continue to receive fluids and medicines through an IV.  Your uterus will be checked and massaged regularly (fundal massage).  You will have some soreness and pain in your abdomen, vagina, and the area of skin between your vaginal opening and your anus (perineum).  If an incision was made near your vagina (episiotomy) or if you had some vaginal tearing during delivery, cold compresses may be placed on your episiotomy or your tear. This helps to reduce pain and swelling.  You may be given a squirt bottle to use instead of wiping when you go to the bathroom. To use the squirt bottle, follow these steps: ? Before you urinate, fill the squirt bottle with warm water. Do not use hot water. ? After you urinate, while you are sitting on the toilet, use the squirt bottle to rinse the area around your urethra and vaginal opening. This rinses away any urine and blood. ? Fill the squirt bottle with clean water every time you use the bathroom.  It is normal to have vaginal bleeding after delivery. Wear a sanitary pad for vaginal bleeding and discharge. Summary  Vaginal delivery means that you will give birth by pushing your baby out of your birth canal (vagina).  Your health care provider may monitor your contractions (uterine monitoring) and your baby's heart rate (fetal monitoring).  Your health care provider may perform  a physical exam.  Normal labor and delivery is divided into three stages.  After labor is over, you and your baby will be monitored closely until you are ready to go home. This information is not intended to replace advice given to you by your health care provider. Make sure you discuss any questions you have with your health care provider. Document Released: 08/31/2008 Document Revised: 12/27/2017 Document Reviewed: 12/27/2017 Elsevier Patient Education  2020 [employer] Fetal Movement Counts Patient Name: ________________________________________________ Patient Due Date: ____________________ What is a fetal movement count?  A fetal movement count is the number of times that you feel your baby move during a certain amount of time. This may also be called a fetal kick count. A fetal movement count is recommended for every pregnant woman. You may be asked to start counting fetal movements as early as week 28 of your pregnancy. Pay attention to when your baby is most active. You may notice your baby's sleep and wake cycles. You may also notice things that make your baby move more. You should do a fetal movement count:  When your baby is normally most   active.  At the same time each day. A good time to count movements is while you are resting, after having something to eat and drink. How do I count fetal movements? 1. Find a quiet, comfortable area. Sit, or lie down on your side. 2. Write down the date, the start time and stop time, and the number of movements that you felt between those two times. Take this information with you to your health care visits. 3. For 2 hours, count kicks, flutters, swishes, rolls, and jabs. You should feel at least 10 movements during 2 hours. 4. You may stop counting after you have felt 10 movements. 5. If you do not feel 10 movements in 2 hours, have something to eat and drink. Then, keep resting and counting for 1 hour. If you feel at least 4 movements during  that hour, you may stop counting. Contact a health care provider if:  You feel fewer than 4 movements in 2 hours.  Your baby is not moving like he or she usually does. Date: ____________ Start time: ____________ Stop time: ____________ Movements: ____________ Date: ____________ Start time: ____________ Stop time: ____________ Movements: ____________ Date: ____________ Start time: ____________ Stop time: ____________ Movements: ____________ Date: ____________ Start time: ____________ Stop time: ____________ Movements: ____________ Date: ____________ Start time: ____________ Stop time: ____________ Movements: ____________ Date: ____________ Start time: ____________ Stop time: ____________ Movements: ____________ Date: ____________ Start time: ____________ Stop time: ____________ Movements: ____________ Date: ____________ Start time: ____________ Stop time: ____________ Movements: ____________ Date: ____________ Start time: ____________ Stop time: ____________ Movements: ____________ This information is not intended to replace advice given to you by your health care provider. Make sure you discuss any questions you have with your health care provider. Document Released: 12/22/2006 Document Revised: 12/12/2018 Document Reviewed: 01/01/2016 Elsevier Patient Education  2020 [employer].

## 2019-07-02 NOTE — Progress Notes (Signed)
ROB-Patient c/o daily mucous vaginal d/c x2 weeks.  Also c/o intermittent abdominal and vaginal pressure "a lot" x2 weeks.

## 2019-07-02 NOTE — Progress Notes (Signed)
ROB-Reports intermittent pelvic pressure, constipation, and increased vaginal discharge over the last two (2) weeks. Discussed normal pregnancy changes and home treatment measures. 36 week cultures collected and SVE performed. Pre-labor checklist and herbal prep handout provided. Requests prenatal lactation consult due to history of latch difficulties, see orders. Anticipatory guidance regarding course of prenatal care. Reviewed red flag symptoms and when to call. RTC x 1 week for ROB or sooner if needed.

## 2019-07-04 LAB — STREP GP B NAA: Strep Gp B NAA: NEGATIVE

## 2019-07-06 LAB — GC/CHLAMYDIA PROBE AMP
Chlamydia trachomatis, NAA: NEGATIVE
Neisseria Gonorrhoeae by PCR: NEGATIVE

## 2019-07-10 ENCOUNTER — Ambulatory Visit (INDEPENDENT_AMBULATORY_CARE_PROVIDER_SITE_OTHER): Payer: PRIVATE HEALTH INSURANCE | Admitting: Certified Nurse Midwife

## 2019-07-10 ENCOUNTER — Other Ambulatory Visit: Payer: Self-pay

## 2019-07-10 ENCOUNTER — Encounter: Payer: Self-pay | Admitting: Certified Nurse Midwife

## 2019-07-10 VITALS — BP 99/72 | HR 89 | Wt 251.0 lb

## 2019-07-10 DIAGNOSIS — Z3493 Encounter for supervision of normal pregnancy, unspecified, third trimester: Secondary | ICD-10-CM

## 2019-07-10 LAB — POCT URINALYSIS DIPSTICK OB
Bilirubin, UA: NEGATIVE
Blood, UA: NEGATIVE
Glucose, UA: NEGATIVE
Ketones, UA: NEGATIVE
Leukocytes, UA: NEGATIVE
Nitrite, UA: NEGATIVE
POC,PROTEIN,UA: NEGATIVE
Spec Grav, UA: 1.01 (ref 1.010–1.025)
Urobilinogen, UA: 0.2 E.U./dL
pH, UA: 7.5 (ref 5.0–8.0)

## 2019-07-10 NOTE — Progress Notes (Signed)
ROB doing well. Feels good movement. SVE per pt request 2/50/-2 . Labor precautions reviewed. Follow up 1 wk.   Philip Aspen, CNM

## 2019-07-10 NOTE — Patient Instructions (Signed)
Braxton Hicks Contractions Contractions of the uterus can occur throughout pregnancy, but they are not always a sign that you are in labor. You may have practice contractions called Braxton Hicks contractions. These false labor contractions are sometimes confused with true labor. What are Braxton Hicks contractions? Braxton Hicks contractions are tightening movements that occur in the muscles of the uterus before labor. Unlike true labor contractions, these contractions do not result in opening (dilation) and thinning of the cervix. Toward the end of pregnancy (32-34 weeks), Braxton Hicks contractions can happen more often and may become stronger. These contractions are sometimes difficult to tell apart from true labor because they can be very uncomfortable. You should not feel embarrassed if you go to the hospital with false labor. Sometimes, the only way to tell if you are in true labor is for your health care provider to look for changes in the cervix. The health care provider will do a physical exam and may monitor your contractions. If you are not in true labor, the exam should show that your cervix is not dilating and your water has not broken. If there are no other health problems associated with your pregnancy, it is completely safe for you to be sent home with false labor. You may continue to have Braxton Hicks contractions until you go into true labor. How to tell the difference between true labor and false labor True labor  Contractions last 30-70 seconds.  Contractions become very regular.  Discomfort is usually felt in the top of the uterus, and it spreads to the lower abdomen and low back.  Contractions do not go away with walking.  Contractions usually become more intense and increase in frequency.  The cervix dilates and gets thinner. False labor  Contractions are usually shorter and not as strong as true labor contractions.  Contractions are usually irregular.  Contractions  are often felt in the front of the lower abdomen and in the groin.  Contractions may go away when you walk around or change positions while lying down.  Contractions get weaker and are shorter-lasting as time goes on.  The cervix usually does not dilate or become thin. Follow these instructions at home:   Take over-the-counter and prescription medicines only as told by your health care provider.  Keep up with your usual exercises and follow other instructions from your health care provider.  Eat and drink lightly if you think you are going into labor.  If Braxton Hicks contractions are making you uncomfortable: ? Change your position from lying down or resting to walking, or change from walking to resting. ? Sit and rest in a tub of warm water. ? Drink enough fluid to keep your urine pale yellow. Dehydration may cause these contractions. ? Do slow and deep breathing several times an hour.  Keep all follow-up prenatal visits as told by your health care provider. This is important. Contact a health care provider if:  You have a fever.  You have continuous pain in your abdomen. Get help right away if:  Your contractions become stronger, more regular, and closer together.  You have fluid leaking or gushing from your vagina.  You pass blood-tinged mucus (bloody show).  You have bleeding from your vagina.  You have low back pain that you never had before.  You feel your baby's head pushing down and causing pelvic pressure.  Your baby is not moving inside you as much as it used to. Summary  Contractions that occur before labor are   called Braxton Hicks contractions, false labor, or practice contractions.  Braxton Hicks contractions are usually shorter, weaker, farther apart, and less regular than true labor contractions. True labor contractions usually become progressively stronger and regular, and they become more frequent.  Manage discomfort from Braxton Hicks contractions  by changing position, resting in a warm bath, drinking plenty of water, or practicing deep breathing. This information is not intended to replace advice given to you by your health care provider. Make sure you discuss any questions you have with your health care provider. Document Released: 04/07/2017 Document Revised: 11/04/2017 Document Reviewed: 04/07/2017 Elsevier Patient Education  2020 Elsevier Inc.  

## 2019-07-14 ENCOUNTER — Encounter: Payer: Self-pay | Admitting: Certified Registered"

## 2019-07-14 ENCOUNTER — Inpatient Hospital Stay
Admission: EM | Admit: 2019-07-14 | Discharge: 2019-07-16 | DRG: 807 | Disposition: A | Discharge status: Home / Self Care | Payer: Medicaid Other | Attending: Certified Nurse Midwife | Admitting: Certified Nurse Midwife

## 2019-07-14 ENCOUNTER — Other Ambulatory Visit: Payer: Self-pay

## 2019-07-14 ENCOUNTER — Encounter: Payer: Self-pay | Admitting: *Deleted

## 2019-07-14 DIAGNOSIS — Z20828 Contact with and (suspected) exposure to other viral communicable diseases: Secondary | ICD-10-CM | POA: Diagnosis present

## 2019-07-14 DIAGNOSIS — O4292 Full-term premature rupture of membranes, unspecified as to length of time between rupture and onset of labor: Principal | ICD-10-CM | POA: Diagnosis present

## 2019-07-14 DIAGNOSIS — O9902 Anemia complicating childbirth: Secondary | ICD-10-CM | POA: Diagnosis not present

## 2019-07-14 DIAGNOSIS — D649 Anemia, unspecified: Secondary | ICD-10-CM | POA: Diagnosis present

## 2019-07-14 DIAGNOSIS — Z3A38 38 weeks gestation of pregnancy: Secondary | ICD-10-CM

## 2019-07-14 DIAGNOSIS — O0930 Supervision of pregnancy with insufficient antenatal care, unspecified trimester: Secondary | ICD-10-CM

## 2019-07-14 DIAGNOSIS — O09529 Supervision of elderly multigravida, unspecified trimester: Secondary | ICD-10-CM

## 2019-07-14 DIAGNOSIS — O4202 Full-term premature rupture of membranes, onset of labor within 24 hours of rupture: Secondary | ICD-10-CM | POA: Diagnosis not present

## 2019-07-14 DIAGNOSIS — Z3490 Encounter for supervision of normal pregnancy, unspecified, unspecified trimester: Secondary | ICD-10-CM

## 2019-07-14 DIAGNOSIS — Z8759 Personal history of other complications of pregnancy, childbirth and the puerperium: Secondary | ICD-10-CM | POA: Diagnosis present

## 2019-07-14 HISTORY — DX: Anemia, unspecified: D64.9

## 2019-07-14 LAB — CBC
HCT: 32.5 % — ABNORMAL LOW (ref 36.0–46.0)
Hemoglobin: 11.1 g/dL — ABNORMAL LOW (ref 12.0–15.0)
MCH: 32.6 pg (ref 26.0–34.0)
MCHC: 34.2 g/dL (ref 30.0–36.0)
MCV: 95.3 fL (ref 80.0–100.0)
Platelets: 283 10*3/uL (ref 150–400)
RBC: 3.41 MIL/uL — ABNORMAL LOW (ref 3.87–5.11)
RDW: 12.8 % (ref 11.5–15.5)
WBC: 6.4 10*3/uL (ref 4.0–10.5)
nRBC: 0 % (ref 0.0–0.2)

## 2019-07-14 LAB — TYPE AND SCREEN
ABO/RH(D): O POS
Antibody Screen: NEGATIVE

## 2019-07-14 LAB — RUPTURE OF MEMBRANE (ROM)PLUS: Rom Plus: POSITIVE

## 2019-07-14 LAB — SARS CORONAVIRUS 2 BY RT PCR (HOSPITAL ORDER, PERFORMED IN ~~LOC~~ HOSPITAL LAB): SARS Coronavirus 2: NEGATIVE

## 2019-07-14 MED ORDER — LACTATED RINGERS IV SOLN: INTRAVENOUS | Status: DC

## 2019-07-14 MED ORDER — OXYCODONE-ACETAMINOPHEN 5-325 MG PO TABS: 2.0000 | ORAL_TABLET | ORAL | Status: DC | PRN

## 2019-07-14 MED ORDER — LIDOCAINE HCL (PF) 1 % IJ SOLN: 30.0000 mL | INTRAMUSCULAR | Status: DC | PRN

## 2019-07-14 MED ORDER — ONDANSETRON HCL 4 MG/2ML IJ SOLN: 4.0000 mg | Freq: Four times a day (QID) | INTRAMUSCULAR | Status: DC | PRN

## 2019-07-14 MED ORDER — OXYCODONE-ACETAMINOPHEN 5-325 MG PO TABS: 1.0000 | ORAL_TABLET | ORAL | Status: DC | PRN

## 2019-07-14 MED ORDER — OXYTOCIN 40 UNITS IN NORMAL SALINE INFUSION - SIMPLE MED
2.5000 [IU]/h | INTRAVENOUS | Status: DC
  Filled 2019-07-14: qty 1000

## 2019-07-14 MED ORDER — OXYTOCIN BOLUS FROM INFUSION
500.0000 mL | Freq: Once | INTRAVENOUS | Status: AC
  Administered 2019-07-15: 500 mL via INTRAVENOUS

## 2019-07-14 MED ORDER — MISOPROSTOL 50MCG HALF TABLET
50.0000 ug | ORAL_TABLET | ORAL | Status: DC
  Administered 2019-07-14: 18:00:00 50 ug via VAGINAL

## 2019-07-14 MED ORDER — ZOLPIDEM TARTRATE 5 MG PO TABS: 5.0000 mg | ORAL_TABLET | Freq: Every evening | ORAL | Status: DC | PRN

## 2019-07-14 MED ORDER — TERBUTALINE SULFATE 1 MG/ML IJ SOLN: 0.2500 mg | Freq: Once | INTRAMUSCULAR | Status: DC | PRN

## 2019-07-14 MED ORDER — SOD CITRATE-CITRIC ACID 500-334 MG/5ML PO SOLN: 30.0000 mL | ORAL | Status: DC | PRN

## 2019-07-14 MED ORDER — MISOPROSTOL 25 MCG QUARTER TABLET
ORAL_TABLET | ORAL | Status: AC
  Administered 2019-07-14: 50 ug via VAGINAL
  Filled 2019-07-14: qty 2

## 2019-07-14 MED ORDER — SODIUM CHLORIDE 0.9 % IV SOLN: 250.0000 mL | INTRAVENOUS | Status: DC | PRN

## 2019-07-14 MED ORDER — LACTATED RINGERS IV SOLN: 500.0000 mL | INTRAVENOUS | Status: DC | PRN

## 2019-07-14 MED ORDER — BUTORPHANOL TARTRATE 1 MG/ML IJ SOLN
1.0000 mg | INTRAMUSCULAR | Status: DC | PRN
  Filled 2019-07-14: qty 1

## 2019-07-14 MED ORDER — OXYTOCIN 40 UNITS IN NORMAL SALINE INFUSION - SIMPLE MED: 1.0000 m[IU]/min | INTRAVENOUS | Status: DC

## 2019-07-14 MED ORDER — SODIUM CHLORIDE 0.9% FLUSH: 3.0000 mL | Freq: Two times a day (BID) | INTRAVENOUS | Status: DC

## 2019-07-14 MED ORDER — ACETAMINOPHEN 325 MG PO TABS
650.0000 mg | ORAL_TABLET | ORAL | Status: DC | PRN
  Filled 2019-07-14: qty 2

## 2019-07-14 MED ORDER — MISOPROSTOL 50MCG HALF TABLET
50.0000 ug | ORAL_TABLET | ORAL | Status: DC
  Administered 2019-07-14: 50 ug via ORAL
  Filled 2019-07-14: qty 1

## 2019-07-14 MED ORDER — SODIUM CHLORIDE 0.9% FLUSH: 3.0000 mL | INTRAVENOUS | Status: DC | PRN

## 2019-07-14 NOTE — OB Triage Note (Signed)
Patient complains of contractions that started around 1pm today.  She also reports feeling like she has been leaking drops of fluid since 7am today.  She rates her contraction pain as 6/10.  No bleeding reported.

## 2019-07-14 NOTE — H&P (Signed)
Obstetric History and Physical  Darrian Mila MerryJ Boer is a 33 y.o. [phone] with IUP at 3321w2d presenting with leakage of clear fluid since 0700 and irregular contractions since 1300.   Patient states she has been having  Irregular contractions in her lower back and abdomen, none vaginal bleeding, ruptured, clear fluid membranes, with active fetal movement.    Denies difficulty breathing or respiratory distress, chest pain, dysuria, and leg pain or swelling.   Prenatal Course  Source of Care: [facility]-initial visit at 18 wks, total visits: 8  Pregnancy complications or risks: Late entry prenatal care, Advanced Maternal Age, Anemia in pregnancy  Prenatal labs and studies:  ABO, Rh: O/Positive/-- (03/25 1055)  Antibody: Negative (03/25 1055)  Rubella: 10.30 (03/25 1055)  Varicella: 1,301 (03/25 1055)  RPR: Non Reactive (06/02 1121)   HBsAg: Negative (03/25 1055)   HIV: Non Reactive (03/25 1055)   AVW:UJWJXBJYGBS:Negative (07/27 1519)  1 hr Glucola: 64 (06/02 1121)  Genetic screening: Low risk female (03/25 1055)  Anatomy US: Complete, normal (04/08 1339)  Past Medical History:  Diagnosis Date  . Anemia     Past Surgical History:  Procedure Laterality Date  . EYE SURGERY     cosmetic surgery    OB History  Gravida Para Term Preterm AB Living  5 1 1   3 1   SAB TAB Ectopic Multiple Live Births    3     1    # Outcome Date GA Lbr Len/2nd Weight Sex Delivery Anes PTL Lv  5 Current           4 TAB 2015          3 TAB 2010          2 TAB 2009          1 Term 2006 4120w0d  2920 g M Vag-Spont EPI N LIV    Social History   Socioeconomic History  . Marital status: Single    Spouse name: Not on file  . Number of children: Not on file  . Years of education: Not on file  . Highest education level: Not on file  Occupational History  . Not on file  Social Needs  . Financial resource strain: Not on file  . Food insecurity    Worry: Not on file    Inability: Not on file  .  Transportation needs    Medical: Not on file    Non-medical: Not on file  Tobacco Use  . Smoking status: Never Smoker  . Smokeless tobacco: Never Used  Substance and Sexual Activity  . Alcohol use: No  . Drug use: Never  . Sexual activity: Yes  Lifestyle  . Physical activity    Days per week: Not on file    Minutes per session: Not on file  . Stress: Not on file  Relationships  . Social Musicianconnections    Talks on phone: Not on file    Gets together: Not on file    Attends religious service: Not on file    Active member of club or organization: Not on file    Attends meetings of clubs or organizations: Not on file    Relationship status: Not on file  Other Topics Concern  . Not on file  Social History Narrative  . Not on file    No family history on file.  Medications Prior to Admission  Medication Sig Dispense Refill Last Dose  . acetaminophen (TYLENOL) 500 MG tablet Take 500 mg  by mouth every 6 (six) hours as needed.   Past Week at Unknown time  . ferrous sulfate 325 (65 FE) MG tablet Take 325 mg by mouth every other day.   Past Week at Unknown time  . Prenatal Vit-Fe Fumarate-FA (PRENATAL MULTIVITAMIN) TABS tablet Take 1 tablet by mouth daily at 12 noon.   07/14/2019 at Unknown time    No Known Allergies  Review of Systems: Negative except for what is mentioned in HPI.  Physical Exam:  Temp:  [98.3 F (36.8 C)] 98.3 F (36.8 C) (08/08 1433) Pulse Rate:  [78] 78 (08/08 1433) Resp:  [16] 16 (08/08 1433) BP: (126)/(72) 126/72 (08/08 1433) Weight:  [113.9 kg] 113.9 kg (08/08 1433)  GENERAL: Well-developed, well-nourished female in no acute distress.   LUNGS: Clear to auscultation bilaterally.   HEART: Regular rate and rhythm.  ABDOMEN: Soft, nontender, nondistended, gravid. EFW by Leopold's approximately seven (7) and a half pounds.   EXTREMITIES: Nontender, no edema, 2+ distal pulses.  Cervical Exam: Dilation: 2 Effacement (%): 30 Cervical Position:  Posterior Station: -3 Exam by:: L Elks RN  FHR: Category I  Contractions: Occasional, soft resting tone   Pertinent Labs/Studies:   Results for orders placed or performed during the hospital encounter of 07/14/19 (from the past 24 hour(s))  ROM Plus ([facility] only)     Status: None   Collection Time: 07/14/19  3:23 PM  Result Value Ref Range   Rom Plus POSITIVE     Assessment :  JOWANNA LOEFFLER is a 33 y.o. [phone] at [redacted]w[redacted]d being admitted for full term premature rupture of membranes, Rh positive, GBS negative, Late Entry to Prenatal Care, Anemia, Advanced Maternal Age  FHR Category I  Plan:  Admit to birthing suites, see orders.   Induction/Augmentation as needed, per protocol.   Birth plan reviewed with patient, declined erythromycin ointment at this time.   Encouraged position change and use of birthing/peanut ball. Copy of [employer] given.   Reviewed red flag symptoms and when to call.   Delivery plan: Hopeful for vaginal delivery.   Dr. Marcelline Mates notified of admission and plan of care.    Diona Fanti, CNM [facility], [facility] 07/14/19 6:04 PM

## 2019-07-14 NOTE — Progress Notes (Signed)
Patient ID: LONA SIX, female   DOB: [birth year], 33 y.o.   MRN: [id number]  Morgan Mann is a 33 y.o. G5P1031 at [redacted]w[redacted]d by LMP admitted for full term premature rupture of membranes  Subjective:  Reports lower abdominal pain and back pain with contractions. Requesting IV pain medication. FOB at bedside for continuous labor support.   Denies difficulty breathing or respiratory distress, chest pain, dysuria, and leg pain or swelling.   Objective:  Temp:  [97.3 F (36.3 C)-98.9 F (37.2 C)] 97.3 F (36.3 C) (08/08 2255) Pulse Rate:  [68-78] 70 (08/08 2255) Resp:  [16-18] 18 (08/08 2255) BP: (126-135)/(67-72) 127/71 (08/08 2255) Weight:  [113.9 kg] 113.9 kg (08/08 1809)  Fetal Wellbeing:  Category I  UC:   irregular, every three (3) to seven (7) minutes; soft resting tone  SVE:   Dilation: 3 Effacement (%): 60 Station: -2 Exam by:: Clementeen Graham, RN  Labs: Lab Results  Component Value Date   WBC 6.4 07/14/2019   HGB 11.1 (L) 07/14/2019   HCT 32.5 (L) 07/14/2019   MCV 95.3 07/14/2019   PLT 283 07/14/2019    Assessment:  Morgan Mann is a 33 y.o. K2H0623 at [redacted]w[redacted]d admitted for full term premature rupture of membranes, Rh positive, GBS negative, Late Entry to Prenatal Care, Anemia, Advanced Maternal Age  FHR Category I  Plan:  Encouraged position change, miles circuit and use of peanut/birthing ball.   Reviewed red flag symptoms and when to call.   Continue orders as written. Reassess as needed.    Diona Fanti, CNM [facility], [facility] 07/14/2019, 11:27 PM

## 2019-07-15 DIAGNOSIS — O9902 Anemia complicating childbirth: Secondary | ICD-10-CM

## 2019-07-15 MED ORDER — WITCH HAZEL-GLYCERIN EX PADS: 1.0000 "application " | MEDICATED_PAD | CUTANEOUS | Status: DC | PRN

## 2019-07-15 MED ORDER — IBUPROFEN 600 MG PO TABS
ORAL_TABLET | ORAL | Status: AC
  Administered 2019-07-15: 600 mg via ORAL
  Filled 2019-07-15: qty 1

## 2019-07-15 MED ORDER — AMMONIA AROMATIC IN INHA
RESPIRATORY_TRACT | Status: AC
  Filled 2019-07-15: qty 10

## 2019-07-15 MED ORDER — IBUPROFEN 600 MG PO TABS: 600.0000 mg | ORAL_TABLET | Freq: Four times a day (QID) | ORAL | 0 refills | Status: DC

## 2019-07-15 MED ORDER — PRENATAL MULTIVITAMIN CH
1.0000 | ORAL_TABLET | Freq: Every day | ORAL | Status: DC
  Administered 2019-07-15: 1 via ORAL
  Filled 2019-07-15: qty 1

## 2019-07-15 MED ORDER — METHYLERGONOVINE MALEATE 0.2 MG/ML IJ SOLN: 0.2000 mg | INTRAMUSCULAR | Status: DC | PRN

## 2019-07-15 MED ORDER — ACETAMINOPHEN 325 MG PO TABS
650.0000 mg | ORAL_TABLET | ORAL | Status: DC | PRN
  Administered 2019-07-15: 650 mg via ORAL
  Filled 2019-07-15: qty 2

## 2019-07-15 MED ORDER — LIDOCAINE HCL (PF) 1 % IJ SOLN
INTRAMUSCULAR | Status: AC
  Filled 2019-07-15: qty 30

## 2019-07-15 MED ORDER — OXYTOCIN 10 UNIT/ML IJ SOLN
INTRAMUSCULAR | Status: AC
  Filled 2019-07-15: qty 2

## 2019-07-15 MED ORDER — MISOPROSTOL 200 MCG PO TABS
ORAL_TABLET | ORAL | Status: AC
  Filled 2019-07-15: qty 4

## 2019-07-15 MED ORDER — COCONUT OIL OIL
1.0000 "application " | TOPICAL_OIL | Status: DC | PRN
  Administered 2019-07-15: 1 via TOPICAL
  Filled 2019-07-15: qty 120

## 2019-07-15 MED ORDER — SODIUM CHLORIDE 0.9% FLUSH: 3.0000 mL | INTRAVENOUS | Status: DC | PRN

## 2019-07-15 MED ORDER — METHYLERGONOVINE MALEATE 0.2 MG PO TABS: 0.2000 mg | ORAL_TABLET | ORAL | Status: DC | PRN

## 2019-07-15 MED ORDER — ONDANSETRON HCL 4 MG/2ML IJ SOLN: 4.0000 mg | INTRAMUSCULAR | Status: DC | PRN

## 2019-07-15 MED ORDER — BENZOCAINE-MENTHOL 20-0.5 % EX AERO
1.0000 "application " | INHALATION_SPRAY | CUTANEOUS | Status: DC | PRN
  Administered 2019-07-15: 1 via TOPICAL
  Filled 2019-07-15: qty 56

## 2019-07-15 MED ORDER — DIBUCAINE (PERIANAL) 1 % EX OINT: 1.0000 "application " | TOPICAL_OINTMENT | CUTANEOUS | Status: DC | PRN

## 2019-07-15 MED ORDER — BENZOCAINE-MENTHOL 20-0.5 % EX AERO
INHALATION_SPRAY | CUTANEOUS | Status: AC
  Administered 2019-07-15: 1 via TOPICAL
  Filled 2019-07-15: qty 56

## 2019-07-15 MED ORDER — OXYCODONE HCL 5 MG PO TABS: 5.0000 mg | ORAL_TABLET | ORAL | Status: DC | PRN

## 2019-07-15 MED ORDER — ONDANSETRON HCL 4 MG PO TABS: 4.0000 mg | ORAL_TABLET | ORAL | Status: DC | PRN

## 2019-07-15 MED ORDER — SIMETHICONE 80 MG PO CHEW: 80.0000 mg | CHEWABLE_TABLET | ORAL | Status: DC | PRN

## 2019-07-15 MED ORDER — OXYCODONE HCL 5 MG PO TABS: 10.0000 mg | ORAL_TABLET | ORAL | Status: DC | PRN

## 2019-07-15 MED ORDER — IBUPROFEN 600 MG PO TABS
600.0000 mg | ORAL_TABLET | Freq: Four times a day (QID) | ORAL | Status: DC
  Administered 2019-07-15 – 2019-07-16 (×4): 600 mg via ORAL
  Filled 2019-07-15 (×4): qty 1

## 2019-07-15 MED ORDER — DIPHENHYDRAMINE HCL 25 MG PO CAPS: 25.0000 mg | ORAL_CAPSULE | Freq: Four times a day (QID) | ORAL | Status: DC | PRN

## 2019-07-15 MED ORDER — SENNOSIDES-DOCUSATE SODIUM 8.6-50 MG PO TABS
2.0000 | ORAL_TABLET | ORAL | Status: DC
  Administered 2019-07-15: 2 via ORAL
  Filled 2019-07-15: qty 2

## 2019-07-15 MED ORDER — SODIUM CHLORIDE 0.9% FLUSH: 3.0000 mL | Freq: Two times a day (BID) | INTRAVENOUS | Status: DC

## 2019-07-15 MED ORDER — SODIUM CHLORIDE 0.9 % IV SOLN: 250.0000 mL | INTRAVENOUS | Status: DC | PRN

## 2019-07-15 NOTE — Plan of Care (Signed)
Pt. Transferred to Room 349 Post Partum. Alert and Oriented with aprop. Affect. Color good, Skin w&d. BBS clear. Oriented to Room and Fall Prevention, Infant Safe Sleep, Surveyor, quantity and Security as well as Technical brewer. Pt. V/O

## 2019-07-15 NOTE — Progress Notes (Signed)
Patient ID: Morgan Mann, female   DOB: December 03, 1986, 33 y.o.   MRN: [id number]  Post Partum Day # 1/2, s/p spontaneous vaginal birth, Rh positive, GBS negative, Late Entry Prenatal Care, Anemia, Advanced Maternal Age  Subjective:  Doing well, notes breastfeeding is "going better than last time". FOB at bedside holding infant.   Denies difficulty breathing or respiratory distress, chest pain, nipple cracks or bleeding, excessive vaginal bleeding, dysuria, and leg pain.   Objective:  Temp:  [97.3 F (36.3 C)-98.9 F (37.2 C)] 98.9 F (37.2 C) (08/09 0853) Pulse Rate:  [60-84] 60 (08/09 0853) Resp:  [16-20] 20 (08/09 0853) BP: (109-141)/(58-77) 109/70 (08/09 0853) SpO2:  [98 %-99 %] 98 % (08/09 0853) Weight:  [113.9 kg] 113.9 kg (08/08 1809)  Physical Exam:   General: alert and cooperative   Lungs: clear to auscultation bilaterally  Breasts: normal appearance, no masses or tenderness   Heart: normal apical impulse  Abdomen: soft, non-tender; bowel sounds normal; no masses,  no organomegaly  Pelvis: Lochia: appropriate, Uterine Fundus: firm  Extremities: DVT Evaluation: no evidence of DVT seen on physical exam.  ROM Plus ([facility] only)     Status: None   Collection Time: 07/14/19  3:23 PM  Result Value Ref Range   Rom Plus POSITIVE     Comment: Performed at [facility], [address], [place], [place] [address]  SARS Coronavirus 2 [facility], Performed in [facility] hospital lab) Nasopharyngeal Nasopharyngeal Swab     Status: None   Collection Time: 07/14/19  5:06 PM   Specimen: Nasopharyngeal Swab  Result Value Ref Range   SARS Coronavirus 2 NEGATIVE NEGATIVE    Comment: (NOTE) If result is NEGATIVE SARS-CoV-2 target nucleic acids are NOT DETECTED. The SARS-CoV-2 RNA is generally detectable in upper and lower  respiratory specimens during the acute phase of infection. The lowest  concentration of SARS-CoV-2 viral copies this assay can detect is 250   copies / mL. A negative result does not preclude SARS-CoV-2 infection  and should not be used as the sole basis for treatment or other  patient management decisions.  A negative result may occur with  improper specimen collection / handling, submission of specimen other  than nasopharyngeal swab, presence of viral mutation(s) within the  areas targeted by this assay, and inadequate number of viral copies  (<250 copies / mL). A negative result must be combined with clinical  observations, patient history, and epidemiological information. If result is POSITIVE SARS-CoV-2 target nucleic acids are DETECTED. The SARS-CoV-2 RNA is generally detectable in upper and lower  respiratory specimens dur ing the acute phase of infection.  Positive  results are indicative of active infection with SARS-CoV-2.  Clinical  correlation with patient history and other diagnostic information is  necessary to determine patient infection status.  Positive results do  not rule out bacterial infection or co-infection with other viruses. If result is PRESUMPTIVE POSTIVE SARS-CoV-2 nucleic acids MAY BE PRESENT.   A presumptive positive result was obtained on the submitted specimen  and confirmed on repeat testing.  While 2019 novel coronavirus  (SARS-CoV-2) nucleic acids may be present in the submitted sample  additional confirmatory testing may be necessary for epidemiological  and / or clinical management purposes  to differentiate between  SARS-CoV-2 and other Sarbecovirus currently known to infect humans.  If clinically indicated additional testing with an alternate test  methodology [phone]) is advised. The SARS-CoV-2 RNA is generally  detectable in upper and lower respiratory  sp ecimens during the acute  phase of infection. The expected result is Negative. Fact Sheet for Patients:  BoilerBrush.com.cyhttps://www.fda.gov/media/136312/download Fact Sheet for Healthcare  Providers: https://pope.com/https://www.fda.gov/media/136313/download This test is not yet approved or cleared by the [place] FDA and has been authorized for detection and/or diagnosis of SARS-CoV-2 by FDA under an Emergency Use Authorization (EUA).  This EUA will remain in effect (meaning this test can be used) for the duration of the COVID-19 declaration under Section 564(b)(1) of the Act, 21 U.S.C. section 360bbb-3(b)(1), unless the authorization is terminated or revoked sooner. Performed at [facility], [address] [place], [place] [address]   CBC     Status: Abnormal   Collection Time: 07/14/19  6:02 PM  Result Value Ref Range   WBC 6.4 4.0 - 10.5 K/uL   RBC 3.41 (L) 3.87 - 5.11 MIL/uL   Hemoglobin 11.1 (L) 12.0 - 15.0 g/dL   HCT 47.832.5 (L) 29.536.0 - 62.146.0 %   MCV 95.3 80.0 - 100.0 fL   MCH 32.6 26.0 - 34.0 pg   MCHC 34.2 30.0 - 36.0 g/dL   RDW 30.812.8 65.711.5 - 84.615.5 %   Platelets 283 150 - 400 K/uL   nRBC 0.0 0.0 - 0.2 %    Comment: Performed at [facility], [address] [place], [place] [address]  Type and screen [facility]     Status: None   Collection Time: 07/14/19  6:02 PM  Result Value Ref Range   ABO/RH(D) O POS    Antibody Screen NEG    Sample Expiration      07/17/2019,2359 Performed at [facility], [address] [place], [place] [address]     Assessment:  33 year old 785P2 Post Partum Day # 1/2, s/p spontaneous vaginal birth, Rh positive, GBS negative, Late Entry Prenatal Care, Anemia, Advanced Maternal Age  Breastfeeding   Plan:  Routine postpartum care and education.  Lactation support provided. Advised patient to contact RN or CNM throughout the day since no LC in house today.   Reviewed red flag symptoms and when to call.   Discharge in AM.     LOS: 1 day   Gunnar BullaJenkins Michelle Naria Abbey, CNM [facility], [facility] 07/15/2019 10:52 AM

## 2019-07-15 NOTE — Discharge Instructions (Signed)
Breastfeeding ° °Choosing to breastfeed is one of the best decisions you can make for yourself and your baby. A change in hormones during pregnancy causes your breasts to make breast milk in your milk-producing glands. Hormones prevent breast milk from being released before your baby is born. They also prompt milk flow after birth. Once breastfeeding has begun, thoughts of your baby, as well as his or her sucking or crying, can stimulate the release of milk from your milk-producing glands. °Benefits of breastfeeding °Research shows that breastfeeding offers many health benefits for infants and mothers. It also offers a cost-free and convenient way to feed your baby. °For your baby °· Your first milk (colostrum) helps your baby's digestive system to function better. °· Special cells in your milk (antibodies) help your baby to fight off infections. °· Breastfed babies are less likely to develop asthma, allergies, obesity, or type 2 diabetes. They are also at lower risk for sudden infant death syndrome (SIDS). °· Nutrients in breast milk are better able to meet your baby’s needs compared to infant formula. °· Breast milk improves your baby's brain development. °For you °· Breastfeeding helps to create a very special bond between you and your baby. °· Breastfeeding is convenient. Breast milk costs nothing and is always available at the correct temperature. °· Breastfeeding helps to burn calories. It helps you to lose the weight that you gained during pregnancy. °· Breastfeeding makes your uterus return faster to its size before pregnancy. It also slows bleeding (lochia) after you give birth. °· Breastfeeding helps to lower your risk of developing type 2 diabetes, osteoporosis, rheumatoid arthritis, cardiovascular disease, and breast, ovarian, uterine, and endometrial cancer later in life. °Breastfeeding basics °Starting breastfeeding °· Find a comfortable place to sit or lie down, with your neck and back  well-supported. °· Place a pillow or a rolled-up blanket under your baby to bring him or her to the level of your breast (if you are seated). Nursing pillows are specially designed to help support your arms and your baby while you breastfeed. °· Make sure that your baby's tummy (abdomen) is facing your abdomen. °· Gently massage your breast. With your fingertips, massage from the outer edges of your breast inward toward the nipple. This encourages milk flow. If your milk flows slowly, you may need to continue this action during the feeding. °· Support your breast with 4 fingers underneath and your thumb above your nipple (make the letter "C" with your hand). Make sure your fingers are well away from your nipple and your baby’s mouth. °· Stroke your baby's lips gently with your finger or nipple. °· When your baby's mouth is open wide enough, quickly bring your baby to your breast, placing your entire nipple and as much of the areola as possible into your baby's mouth. The areola is the colored area around your nipple. °? More areola should be visible above your baby's upper lip than below the lower lip. °? Your baby's lips should be opened and extended outward (flanged) to ensure an adequate, comfortable latch. °? Your baby's tongue should be between his or her lower gum and your breast. °· Make sure that your baby's mouth is correctly positioned around your nipple (latched). Your baby's lips should create a seal on your breast and be turned out (everted). °· It is common for your baby to suck about 2-3 minutes in order to start the flow of breast milk. °Latching °Teaching your baby how to latch onto your breast properly is   very important. An improper latch can cause nipple pain, decreased milk supply, and poor weight gain in your baby. Also, if your baby is not latched onto your nipple properly, he or she may swallow some air during feeding. This can make your baby fussy. Burping your baby when you switch breasts  during the feeding can help to get rid of the air. However, teaching your baby to latch on properly is still the best way to prevent fussiness from swallowing air while breastfeeding. °Signs that your baby has successfully latched onto your nipple °· Silent tugging or silent sucking, without causing you pain. Infant's lips should be extended outward (flanged). °· Swallowing heard between every 3-4 sucks once your milk has started to flow (after your let-down milk reflex occurs). °· Muscle movement above and in front of his or her ears while sucking. °Signs that your baby has not successfully latched onto your nipple °· Sucking sounds or smacking sounds from your baby while breastfeeding. °· Nipple pain. °If you think your baby has not latched on correctly, slip your finger into the corner of your baby’s mouth to break the suction and place it between your baby's gums. Attempt to start breastfeeding again. °Signs of successful breastfeeding °Signs from your baby °· Your baby will gradually decrease the number of sucks or will completely stop sucking. °· Your baby will fall asleep. °· Your baby's body will relax. °· Your baby will retain a small amount of milk in his or her mouth. °· Your baby will let go of your breast by himself or herself. °Signs from you °· Breasts that have increased in firmness, weight, and size 1-3 hours after feeding. °· Breasts that are softer immediately after breastfeeding. °· Increased milk volume, as well as a change in milk consistency and color by the fifth day of breastfeeding. °· Nipples that are not sore, cracked, or bleeding. °Signs that your baby is getting enough milk °· Wetting at least 1-2 diapers during the first 24 hours after birth. °· Wetting at least 5-6 diapers every 24 hours for the first week after birth. The urine should be clear or pale yellow by the age of 5 days. °· Wetting 6-8 diapers every 24 hours as your baby continues to grow and develop. °· At least 3 stools in  a 24-hour period by the age of 5 days. The stool should be soft and yellow. °· At least 3 stools in a 24-hour period by the age of 7 days. The stool should be seedy and yellow. °· No loss of weight greater than 10% of birth weight during the first 3 days of life. °· Average weight gain of 4-7 oz (113-198 g) per week after the age of 4 days. °· Consistent daily weight gain by the age of 5 days, without weight loss after the age of 2 weeks. °After a feeding, your baby may spit up a small amount of milk. This is normal. °Breastfeeding frequency and duration °Frequent feeding will help you make more milk and can prevent sore nipples and extremely full breasts (breast engorgement). Breastfeed when you feel the need to reduce the fullness of your breasts or when your baby shows signs of hunger. This is called "breastfeeding on demand." Signs that your baby is hungry include: °· Increased alertness, activity, or restlessness. °· Movement of the head from side to side. °· Opening of the mouth when the corner of the mouth or cheek is stroked (rooting). °· Increased sucking sounds, smacking lips, cooing,   sighing, or squeaking.  Hand-to-mouth movements and sucking on fingers or hands.  Fussing or crying. Avoid introducing a pacifier to your baby in the first 4-6 weeks after your baby is born. After this time, you may choose to use a pacifier. Research has shown that pacifier use during the first year of a baby's life decreases the risk of sudden infant death syndrome (SIDS). Allow your baby to feed on each breast as long as he or she wants. When your baby unlatches or falls asleep while feeding from the first breast, offer the second breast. Because newborns are often sleepy in the first few weeks of life, you may need to awaken your baby to get him or her to feed. Breastfeeding times will vary from baby to baby. However, the following rules can serve as a guide to help you make sure that your baby is properly  fed:  Newborns (babies 4 weeks of age or younger) may breastfeed every 1-3 hours.  Newborns should not go without breastfeeding for longer than 3 hours during the day or 5 hours during the night.  You should breastfeed your baby a minimum of 8 times in a 24-hour period. Breast milk pumping     Pumping and storing breast milk allows you to make sure that your baby is exclusively fed your breast milk, even at times when you are unable to breastfeed. This is especially important if you go back to work while you are still breastfeeding, or if you are not able to be present during feedings. Your lactation consultant can help you find a method of pumping that works best for you and give you guidelines about how long it is safe to store breast milk. Caring for your breasts while you breastfeed Nipples can become dry, cracked, and sore while breastfeeding. The following recommendations can help keep your breasts moisturized and healthy:  Avoid using soap on your nipples.  Wear a supportive bra designed especially for nursing. Avoid wearing underwire-style bras or extremely tight bras (sports bras).  Air-dry your nipples for 3-4 minutes after each feeding.  Use only cotton bra pads to absorb leaked breast milk. Leaking of breast milk between feedings is normal.  Use lanolin on your nipples after breastfeeding. Lanolin helps to maintain your skin's normal moisture barrier. Pure lanolin is not harmful (not toxic) to your baby. You may also hand express a few drops of breast milk and gently massage that milk into your nipples and allow the milk to air-dry. In the first few weeks after giving birth, some women experience breast engorgement. Engorgement can make your breasts feel heavy, warm, and tender to the touch. Engorgement peaks within 3-5 days after you give birth. The following recommendations can help to ease engorgement:  Completely empty your breasts while breastfeeding or pumping. You may  want to start by applying warm, moist heat (in the shower or with warm, water-soaked hand towels) just before feeding or pumping. This increases circulation and helps the milk flow. If your baby does not completely empty your breasts while breastfeeding, pump any extra milk after he or she is finished.  Apply ice packs to your breasts immediately after breastfeeding or pumping, unless this is too uncomfortable for you. To do this: ? Put ice in a plastic bag. ? Place a towel between your skin and the bag. ? Leave the ice on for 20 minutes, 2-3 times a day.  Make sure that your baby is latched on and positioned properly while breastfeeding. If   engorgement persists after 48 hours of following these recommendations, contact your health care provider or a Advertising copywriterlactation consultant. Overall health care recommendations while breastfeeding  Eat 3 healthy meals and 3 snacks every day. Well-nourished mothers who are breastfeeding need an additional 450-500 calories a day. You can meet this requirement by increasing the amount of a balanced diet that you eat.  Drink enough water to keep your urine pale yellow or clear.  Rest often, relax, and continue to take your prenatal vitamins to prevent fatigue, stress, and low vitamin and mineral levels in your body (nutrient deficiencies).  Do not use any products that contain nicotine or tobacco, such as cigarettes and e-cigarettes. Your baby may be harmed by chemicals from cigarettes that pass into breast milk and exposure to secondhand smoke. If you need help quitting, ask your health care provider.  Avoid alcohol.  Do not use illegal drugs or marijuana.  Talk with your health care provider before taking any medicines. These include over-the-counter and prescription medicines as well as vitamins and herbal supplements. Some medicines that may be harmful to your baby can pass through breast milk.  It is possible to become pregnant while breastfeeding. If birth  control is desired, ask your health care provider about options that will be safe while breastfeeding your baby. Where to find more information: [employer]: www.llli.org Contact a health care provider if:  You feel like you want to stop breastfeeding or have become frustrated with breastfeeding.  Your nipples are cracked or bleeding.  Your breasts are red, tender, or warm.  You have: ? Painful breasts or nipples. ? A swollen area on either breast. ? A fever or chills. ? Nausea or vomiting. ? Drainage other than breast milk from your nipples.  Your breasts do not become full before feedings by the fifth day after you give birth.  You feel sad and depressed.  Your baby is: ? Too sleepy to eat well. ? Having trouble sleeping. ? More than 721 week old and wetting fewer than 6 diapers in a 24-hour period. ? Not gaining weight by 355 days of age.  Your baby has fewer than 3 stools in a 24-hour period.  Your baby's skin or the white parts of his or her eyes become yellow. Get help right away if:  Your baby is overly tired (lethargic) and does not want to wake up and feed.  Your baby develops an unexplained fever. Summary  Breastfeeding offers many health benefits for infant and mothers.  Try to breastfeed your infant when he or she shows early signs of hunger.  Gently tickle or stroke your baby's lips with your finger or nipple to allow the baby to open his or her mouth. Bring the baby to your breast. Make sure that much of the areola is in your baby's mouth. Offer one side and burp the baby before you offer the other side.  Talk with your health care provider or lactation consultant if you have questions or you face problems as you breastfeed. This information is not intended to replace advice given to you by your health care provider. Make sure you discuss any questions you have with your health care provider. Document Released: 11/22/2005 Document Revised:  02/16/2018 Document Reviewed: 12/24/2016 Elsevier Patient Education  2020 [employer]   Breast Pumping Tips There may be times when you cannot feed your baby from your breast, such as when you are at work or on a trip. Breast pumping allows you  to remove milk from your breast in order to store for later use. There are three ways to pump. You can use:  Your hand to massage and squeeze your breast (hand expression).  A handheld manual pump.  An electric pump. When you first start to pump, you may not get much milk, but after a few days your breasts should start to make more. Pumping can help stimulate your milk supply after your baby is born. It can also help maintain your milk supply when you are away from your baby. When should I pump? You can start pumping soon after your baby is born. Here are some tips on when to pump:  When with your baby: ? Pump after breastfeeding. ? Pump from the free breast while you breastfeed.  When away from your baby: ? Pump every 2-3 hours for about 15 minutes. ? Pump both breasts at the same time if you can.  If your baby gets formula feeding, pump around the time your baby gets that feeding.  If you drank alcohol, wait 2 hours before pumping.  If you are having a procedure with anesthesia, talk to your health care provider about when you should pump before and after. How do I prepare to pump? Take steps to relax. This makes it easier to stimulate your let-down reflex, which is what makes breast milk flow. To help:  Smell one of your infant's blankets or an item of clothing.  Look at a picture or video of your infant.  Sit in a quiet, private space.  Massage your breast and nipple.  Place a warm cloth on your breast. The cloth should be a little wet.  Play relaxing music.  Picture your milk flowing. What are some tips? General tips for pumping breast milk   Always wash your hands before pumping.  If you are not getting very much  milk or pumping is uncomfortable, make adjustments to your pump or try using different type of pumps.  Drink enough fluid to keep your urine clear or pale yellow.  Wear clothing that opens in the front or allows easy access to your breasts.  Pump breast milk directly into clean bottles or other storage containers.  Do not use any products that contain nicotine or tobacco, such as cigarettes and e-cigarettes. These can lower your milk supply and harm your infant. If you need help quitting, ask your health care provider. Tips for storing breast milk   Store breast milk in a clean, BPA-free container, such as glass or plastic bottles or milk storage bags.  Store breast milk in 2-4 ounce batches to reduce waste.  Swirl the breast milk in the container to mix any cream that floats to the top. Do not shake it.  Label all stored milk with the date you pumped it.  The amount of time you can keep breast milk depends on where it is stored: ? Room temperature: 6-8 hours, if the milk is clean. It is best if used within 4 hours. ? Cooler with ice packs: 24 hours. ? Refrigerator: 5-8 days, if the milk is clean. It is best if used within 3 days. ? Freezer: 9-12 months, if the milk is clean and stored away from the freezer door. It is best if used within 6 months.  When using a refrigerator or freezer, put the milk in the back to keep it as cold as possible.  Thaw frozen milk using warm water. Do not use the microwave. Tips for choosing a  breast pump The right pump for you will depend on your comfort and how often you will be away from your baby. When choosing a pump, consider the following:  Manual breast pumps do not need electricity to work. They are usually cheaper than electric pumps, but they can be harder to use. They may be a good choice if you are occasionally away from your baby.  Electric breast pumps are usually more expensive than manual pumps, but they can be easier for some women to  use. They can also collect more milk than manual pumps. This makes them a good choice for women who work in an office or need to be away from their baby for longer periods of time.  The suction cup (flange) should be the right size. If it is the wrong size, it may cause pain and nipple damage.  Before buying a pump, find out whether your insurance covers the cost of a breast pump. Tips for maintaining a breast pump  Check your pump's manual for cleaning tips.  Clean the pump after each use. To do this: ? Wipe down the electrical unit. Use a dry, soft cloth or clean paper towel. Do not put the electrical unit in water or cleaning products. ? Wash the plastic pump parts with soap and warm water or in the dishwasher, if the parts are dishwasher safe. You do not need to clean the tubing unless it comes in contact with breast milk. Let the parts air dry. Avoid drying them with a cloth or towel. ? When the pump parts are clean and dry, put the pump back together. Then store the pump.  If there is water in the tubing when it comes time to pump, attach the tubing to the pump and turn on the pump. Run the pump until the tube is dry.  Avoid touching the inside of pump parts that come in contact with breast milk. Summary  Pumping can help stimulate your milk supply after your baby is born. It can also help maintain your milk supply when you are away from your baby.  When you are away from your infant for several hours, pump for about 15 minutes every 2-3 hours. Pump both breasts at the same time, if you can.  Your health care provider or lactation consultant can help you decide which breast pump is right for you. The right pump for you depends on your comfort, work schedule, and how often you may be away from your baby. This information is not intended to replace advice given to you by your health care provider. Make sure you discuss any questions you have with your health care provider. Document  Released: 05/12/2010 Document Revised: 03/14/2019 Document Reviewed: 12/27/2016 Elsevier Patient Education  2020 [employer] Inc.   Postpartum Care After Vaginal Delivery This sheet gives you information about how to care for yourself from the time you deliver your baby to up to 6-12 weeks after delivery (postpartum period). Your health care provider may also give you more specific instructions. If you have problems or questions, contact your health care provider. Follow these instructions at home: Vaginal bleeding  It is normal to have vaginal bleeding (lochia) after delivery. Wear a sanitary pad for vaginal bleeding and discharge. ? During the first week after delivery, the amount and appearance of lochia is often similar to a menstrual period. ? Over the next few weeks, it will gradually decrease to a dry, yellow-brown discharge. ? For most women, lochia stops completely by  4-6 weeks after delivery. Vaginal bleeding can vary from woman to woman.  Change your sanitary pads frequently. Watch for any changes in your flow, such as: ? A sudden increase in volume. ? A change in color. ? Large blood clots.  If you pass a blood clot from your vagina, save it and call your health care provider to discuss. Do not flush blood clots down the toilet before talking with your health care provider.  Do not use tampons or douches until your health care provider says this is safe.  If you are not breastfeeding, your period should return 6-8 weeks after delivery. If you are feeding your child breast milk only (exclusive breastfeeding), your period may not return until you stop breastfeeding. Perineal care  Keep the area between the vagina and the anus (perineum) clean and dry as told by your health care provider. Use medicated pads and pain-relieving sprays and creams as directed.  If you had a cut in the perineum (episiotomy) or a tear in the vagina, check the area for signs of infection until you are  healed. Check for: ? More redness, swelling, or pain. ? Fluid or blood coming from the cut or tear. ? Warmth. ? Pus or a bad smell.  You may be given a squirt bottle to use instead of wiping to clean the perineum area after you go to the bathroom. As you start healing, you may use the squirt bottle before wiping yourself. Make sure to wipe gently.  To relieve pain caused by an episiotomy, a tear in the vagina, or swollen veins in the anus (hemorrhoids), try taking a warm sitz bath 2-3 times a day. A sitz bath is a warm water bath that is taken while you are sitting down. The water should only come up to your hips and should cover your buttocks. Breast care  Within the first few days after delivery, your breasts may feel heavy, full, and uncomfortable (breast engorgement). Milk may also leak from your breasts. Your health care provider can suggest ways to help relieve the discomfort. Breast engorgement should go away within a few days.  If you are breastfeeding: ? Wear a bra that supports your breasts and fits you well. ? Keep your nipples clean and dry. Apply creams and ointments as told by your health care provider. ? You may need to use breast pads to absorb milk that leaks from your breasts. ? You may have uterine contractions every time you breastfeed for up to several weeks after delivery. Uterine contractions help your uterus return to its normal size. ? If you have any problems with breastfeeding, work with your health care provider or Advertising copywriter.  If you are not breastfeeding: ? Avoid touching your breasts a lot. Doing this can make your breasts produce more milk. ? Wear a good-fitting bra and use cold packs to help with swelling. ? Do not squeeze out (express) milk. This causes you to make more milk. Intimacy and sexuality  Ask your health care provider when you can engage in sexual activity. This may depend on: ? Your risk of infection. ? How fast you are  healing. ? Your comfort and desire to engage in sexual activity.  You are able to get pregnant after delivery, even if you have not had your period. If desired, talk with your health care provider about methods of birth control (contraception). Medicines  Take over-the-counter and prescription medicines only as told by your health care provider.  If you were  prescribed an antibiotic medicine, take it as told by your health care provider. Do not stop taking the antibiotic even if you start to feel better. Activity  Gradually return to your normal activities as told by your health care provider. Ask your health care provider what activities are safe for you.  Rest as much as possible. Try to rest or take a nap while your baby is sleeping. Eating and drinking   Drink enough fluid to keep your urine pale yellow.  Eat high-fiber foods every day. These may help prevent or relieve constipation. High-fiber foods include: ? Whole grain cereals and breads. ? Brown rice. ? Beans. ? Fresh fruits and vegetables.  Do not try to lose weight quickly by cutting back on calories.  Take your prenatal vitamins until your postpartum checkup or until your health care provider tells you it is okay to stop. Lifestyle  Do not use any products that contain nicotine or tobacco, such as cigarettes and e-cigarettes. If you need help quitting, ask your health care provider.  Do not drink alcohol, especially if you are breastfeeding. General instructions  Keep all follow-up visits for you and your baby as told by your health care provider. Most women visit their health care provider for a postpartum checkup within the first 3-6 weeks after delivery. Contact a health care provider if:  You feel unable to cope with the changes that your child brings to your life, and these feelings do not go away.  You feel unusually sad or worried.  Your breasts become red, painful, or hard.  You have a fever.  You  have trouble holding urine or keeping urine from leaking.  You have little or no interest in activities you used to enjoy.  You have not breastfed at all and you have not had a menstrual period for 12 weeks after delivery.  You have stopped breastfeeding and you have not had a menstrual period for 12 weeks after you stopped breastfeeding.  You have questions about caring for yourself or your baby.  You pass a blood clot from your vagina. Get help right away if:  You have chest pain.  You have difficulty breathing.  You have sudden, severe leg pain.  You have severe pain or cramping in your lower abdomen.  You bleed from your vagina so much that you fill more than one sanitary pad in one hour. Bleeding should not be heavier than your heaviest period.  You develop a severe headache.  You faint.  You have blurred vision or spots in your vision.  You have bad-smelling vaginal discharge.  You have thoughts about hurting yourself or your baby. If you ever feel like you may hurt yourself or others, or have thoughts about taking your own life, get help right away. You can go to the nearest emergency department or call:  Your local emergency services (911 in the [place]  A suicide crisis helpline, such as the National Suicide Prevention Lifeline at 201-886-58131-567-340-1856. This is open 24 hours a day. Summary  The period of time right after you deliver your newborn up to 6-12 weeks after delivery is called the postpartum period.  Gradually return to your normal activities as told by your health care provider.  Keep all follow-up visits for you and your baby as told by your health care provider. This information is not intended to replace advice given to you by your health care provider. Make sure you discuss any questions you have with your health care  provider. Document Released: 09/19/2007 Document Revised: 11/25/2017 Document Reviewed: 09/05/2017 Elsevier Patient Education  2020  [employer]  Postpartum Baby Blues The postpartum period begins right after the birth of a baby. During this time, there is often a lot of joy and excitement. It is also a time of many changes in the life of the parents. No matter how many times a mother gives birth, each child brings new challenges to the family, including different ways of relating to one another. It is common to have feelings of excitement along with confusing changes in moods, emotions, and thoughts. You may feel happy one minute and sad or stressed the next. These feelings of sadness usually happen in the period right after you have your baby, and they go away within a week or two. This is called the "baby blues." What are the causes? There is no known cause of baby blues. It is likely caused by a combination of factors. However, changes in hormone levels after childbirth are believed to trigger some of the symptoms. Other factors that can play a role in these mood changes include:  Lack of sleep.  Stressful life events, such as poverty, caring for a loved one, or death of a loved one.  Genetics. What are the signs or symptoms? Symptoms of this condition include:  Brief changes in mood, such as going from extreme happiness to sadness.  Decreased concentration.  Difficulty sleeping.  Crying spells and tearfulness.  Loss of appetite.  Irritability.  Anxiety. If the symptoms of baby blues last for more than 2 weeks or become more severe, you may have postpartum depression. How is this diagnosed? This condition is diagnosed based on an evaluation of your symptoms. There are no medical or lab tests that lead to a diagnosis, but there are various questionnaires that a health care provider may use to identify women with the baby blues or postpartum depression. How is this treated? Treatment is not needed for this condition. The baby blues usually go away on their own in 1-2 weeks. Social support is often all that is  needed. You will be encouraged to get adequate sleep and rest. Follow these instructions at home: Lifestyle      Get as much rest as you can. Take a nap when the baby sleeps.  Exercise regularly as told by your health care provider. Some women find yoga and walking to be helpful.  Eat a balanced and nourishing diet. This includes plenty of fruits and vegetables, whole grains, and lean proteins.  Do little things that you enjoy. Have a cup of tea, take a bubble bath, read your favorite magazine, or listen to your favorite music.  Avoid alcohol.  Ask for help with household chores, cooking, grocery shopping, or running errands. Do not try to do everything yourself. Consider hiring a postpartum doula to help. This is a professional who specializes in providing support to new mothers.  Try not to make any major life changes during pregnancy or right after giving birth. This can add stress. General instructions  Talk to people close to you about how you are feeling. Get support from your partner, family members, friends, or other new moms. You may want to join a support group.  Find ways to cope with stress. This may include: ? Writing your thoughts and feelings in a journal. ? Spending time outside. ? Spending time with people who make you laugh.  Try to stay positive in how you think. Think about the things  you are grateful for.  Take over-the-counter and prescription medicines only as told by your health care provider.  Let your health care provider know if you have any concerns.  Keep all postpartum visits as told by your health care provider. This is important. Contact a health care provider if:  Your baby blues do not go away after 2 weeks. Get help right away if:  You have thoughts of taking your own life (suicidal thoughts).  You think you may harm the baby or other people.  You see or hear things that are not there (hallucinations). Summary  After giving birth, you  may feel happy one minute and sad or stressed the next. Feelings of sadness that happen right after the baby is born and go away after a week or two are called the "baby blues."  You can manage the baby blues by getting enough rest, eating a healthy diet, exercising, spending time with supportive people, and finding ways to cope with stress.  If feelings of sadness and stress last longer than 2 weeks or get in the way of caring for your baby, talk to your health care provider. This may mean you have postpartum depression. This information is not intended to replace advice given to you by your health care provider. Make sure you discuss any questions you have with your health care provider. Document Released: 08/26/2004 Document Revised: 03/16/2019 Document Reviewed: 01/18/2017 Elsevier Patient Education  [place].  Levonorgestrel intrauterine device (IUD) What is this medicine? LEVONORGESTREL IUD (LEE voe nor jes trel) is a contraceptive (birth control) device. The device is placed inside the uterus by a healthcare professional. It is used to prevent pregnancy. This device can also be used to treat heavy bleeding that occurs during your period. This medicine may be used for other purposes; ask your health care provider or pharmacist if you have questions. COMMON BRAND NAME(S): Minette Headland What should I tell my health care provider before I take this medicine? They need to know if you have any of these conditions:  abnormal Pap smear  cancer of the breast, uterus, or cervix  diabetes  endometritis  genital or pelvic infection now or in the past  have more than one sexual partner or your partner has more than one partner  heart disease  history of an ectopic or tubal pregnancy  immune system problems  IUD in place  liver disease or tumor  problems with blood clots or take blood-thinners  seizures  use intravenous drugs  uterus of unusual  shape  vaginal bleeding that has not been explained  an unusual or allergic reaction to levonorgestrel, other hormones, silicone, or polyethylene, medicines, foods, dyes, or preservatives  pregnant or trying to get pregnant  breast-feeding How should I use this medicine? This device is placed inside the uterus by a health care professional. Talk to your pediatrician regarding the use of this medicine in children. Special care may be needed. Overdosage: If you think you have taken too much of this medicine contact a poison control center or emergency room at once. NOTE: This medicine is only for you. Do not share this medicine with others. What if I miss a dose? This does not apply. Depending on the brand of device you have inserted, the device will need to be replaced every 3 to 6 years if you wish to continue using this type of birth control. What may interact with this medicine? Do not take this medicine with any  of the following medications:  amprenavir  bosentan  fosamprenavir This medicine may also interact with the following medications:  aprepitant  armodafinil  barbiturate medicines for inducing sleep or treating seizures  bexarotene  boceprevir  griseofulvin  medicines to treat seizures like carbamazepine, ethotoin, felbamate, oxcarbazepine, phenytoin, topiramate  modafinil  pioglitazone  rifabutin  rifampin  rifapentine  some medicines to treat HIV infection like atazanavir, efavirenz, indinavir, lopinavir, nelfinavir, tipranavir, ritonavir  St. John's wort  warfarin This list may not describe all possible interactions. Give your health care provider a list of all the medicines, herbs, non-prescription drugs, or dietary supplements you use. Also tell them if you smoke, drink alcohol, or use illegal drugs. Some items may interact with your medicine. What should I watch for while using this medicine? Visit your doctor or health care professional for  regular check ups. See your doctor if you or your partner has sexual contact with others, becomes HIV positive, or gets a sexual transmitted disease. This product does not protect you against HIV infection (AIDS) or other sexually transmitted diseases. You can check the placement of the IUD yourself by reaching up to the top of your vagina with clean fingers to feel the threads. Do not pull on the threads. It is a good habit to check placement after each menstrual period. Call your doctor right away if you feel more of the IUD than just the threads or if you cannot feel the threads at all. The IUD may come out by itself. You may become pregnant if the device comes out. If you notice that the IUD has come out use a backup birth control method like condoms and call your health care provider. Using tampons will not change the position of the IUD and are okay to use during your period. This IUD can be safely scanned with magnetic resonance imaging (MRI) only under specific conditions. Before you have an MRI, tell your healthcare provider that you have an IUD in place, and which type of IUD you have in place. What side effects may I notice from receiving this medicine? Side effects that you should report to your doctor or health care professional as soon as possible:  allergic reactions like skin rash, itching or hives, swelling of the face, lips, or tongue  fever, flu-like symptoms  genital sores  high blood pressure  no menstrual period for 6 weeks during use  pain, swelling, warmth in the leg  pelvic pain or tenderness  severe or sudden headache  signs of pregnancy  stomach cramping  sudden shortness of breath  trouble with balance, talking, or walking  unusual vaginal bleeding, discharge  yellowing of the eyes or skin Side effects that usually do not require medical attention (report to your doctor or health care professional if they continue or are bothersome):  acne  breast  pain  change in sex drive or performance  changes in weight  cramping, dizziness, or faintness while the device is being inserted  headache  irregular menstrual bleeding within first 3 to 6 months of use  nausea This list may not describe all possible side effects. Call your doctor for medical advice about side effects. You may report side effects to FDA at 1-800-FDA-1088. Where should I keep my medicine? This does not apply. NOTE: This sheet is a summary. It may not cover all possible information. If you have questions about this medicine, talk to your doctor, pharmacist, or health care provider.  2020 Elsevier/Gold Standard (2018-10-03 13:22:01)

## 2019-07-16 LAB — RPR: RPR Ser Ql: NONREACTIVE

## 2019-07-16 LAB — CBC
HCT: 31.2 % — ABNORMAL LOW (ref 36.0–46.0)
Hemoglobin: 10.4 g/dL — ABNORMAL LOW (ref 12.0–15.0)
MCH: 32 pg (ref 26.0–34.0)
MCHC: 33.3 g/dL (ref 30.0–36.0)
MCV: 96 fL (ref 80.0–100.0)
Platelets: 278 10*3/uL (ref 150–400)
RBC: 3.25 MIL/uL — ABNORMAL LOW (ref 3.87–5.11)
RDW: 13 % (ref 11.5–15.5)
WBC: 8.4 10*3/uL (ref 4.0–10.5)
nRBC: 0 % (ref 0.0–0.2)

## 2019-07-16 NOTE — Progress Notes (Signed)
Pt discharged with infant. Discharge instructions, prescriptions, and follow up appointments given to and reviewed with patient. Pt verbalized understanding. Escorted out by auxillary.  

## 2019-07-16 NOTE — Final Progress Note (Signed)
Discharge Day SOAP Note:  Progress Note - Vaginal Delivery  Morgan Mann is a 33 y.o. [id number] now PP day 1 s/p Vaginal, Spontaneous . Delivery was uncomplicated  Subjective  The patient has the following complaints: has no unusual complaints  Pain is controlled with current medications.   Patient is urinating without difficulty.  She is ambulating well.     Objective  Vital signs: BP 125/79 (BP Location: Right Arm)   Pulse 63   Temp 98.5 F (36.9 C) (Oral)   Resp 20   Ht 5\' 8"  (1.727 m)   Wt 113.9 kg   LMP 10/19/2018   SpO2 100%   Breastfeeding Unknown   BMI 38.16 kg/m   Physical Exam: Gen: NAD Fundus Fundal Tone: Firm  Lochia Amount: Scant  Perineum Appearance: Approximated     Data Review Labs: CBC Latest Ref Rng & Units 07/16/2019 07/14/2019 05/08/2019  WBC 4.0 - 10.5 K/uL 8.4 6.4 5.6  Hemoglobin 12.0 - 15.0 g/dL 10.4(L) 11.1(L) 10.7(L)  Hematocrit 36.0 - 46.0 % 31.2(L) 32.5(L) 30.8(L)  Platelets 150 - 400 K/uL 278 283 302   O POS  Assessment/Plan  Active Problems:   Pregnancy   Full-term premature rupture of membranes    Plan for discharge today.   Discharge Instructions: Per After Visit Summary. Activity: Advance as tolerated. Pelvic rest for 6 weeks.  Also refer to After Visit Summary Diet: Regular Medications: Allergies as of 07/16/2019   No Known Allergies     Medication List    TAKE these medications   acetaminophen 500 MG tablet Commonly known as: TYLENOL Take 500 mg by mouth every 6 (six) hours as needed.   ferrous sulfate 325 (65 FE) MG tablet Take 325 mg by mouth every other day.   ibuprofen 600 MG tablet Commonly known as: ADVIL Take 1 tablet (600 mg total) by mouth every 6 (six) hours.   prenatal multivitamin Tabs tablet Take 1 tablet by mouth daily at 12 noon.      Outpatient follow up:  Follow-up Information    Diona Fanti, CNM. Schedule an appointment as soon as possible for a visit in 6 week(s).    Specialties: Certified Nurse Midwife, Obstetrics and Gynecology, Radiology Why: Please call to schedule six (6) week postpartum visit Contact information: [place] [place] [address] [phone]          Postpartum contraception: Mirena IUD Discharged Condition: good  Discharged to: home  Newborn Data: Disposition:home with mother  Apgars: APGAR (1 MIN): 8   APGAR (5 MINS): 9   APGAR (10 MINS):    Baby Feeding: Breast    Philip Aspen, CNM  07/16/2019 8:21 AM

## 2019-07-16 NOTE — Lactation Note (Signed)
This note was copied from a baby's chart. Lactation Consultation Note  Patient Name: Morgan Mann [id number] Date: 07/16/2019   [facility] intern observed baby already latched and breastfeeding.  Mom has been hearing swallows and is offering breast on demand.  The only latch assistance that was needed was to show mom how to flange baby's lips out.  We reviewed feeding cues and cluster-feeding.  Mom was taught diaper frequency, signs of milk transfer, and signs of satiety.  Mom and baby are breastfeeding well and feel confident about breastfeeding after discharge.  Mom inquired about pumping and was told to wait until 4-6 weeks before her start day, preferably after a month has passed.  [facility] intern briefly reviewed how to freeze and thaw pumped milk but insisted she call the office when she wanted to begin pumping.  Mom was informed about engorgement relief methods, virtual support groups, and outpatient consults as well.  Maternal Data    Feeding Feeding Type: Breast Fed  LATCH Score Latch: Grasps breast easily, tongue down, lips flanged, rhythmical sucking.  Audible Swallowing: Spontaneous and intermittent  Type of Nipple: Everted at rest and after stimulation  Comfort (Breast/Nipple): Soft / non-tender  Hold (Positioning): No assistance needed to correctly position infant at breast.  LATCH Score: 10  Interventions Interventions: Assisted with latch(helped flanged lips)  Lactation Tools Discussed/Used     Consult Status Consult Status: Complete    Lavonia Drafts 07/16/2019, 10:21 AM

## 2019-07-16 NOTE — Discharge Summary (Signed)
Discharge Summary  Date of Admission: 07/14/2019  Date of Discharge: 07/16/2019  Admitting Diagnosis: SROM at [redacted]w[redacted]d  Mode of Delivery: normal spontaneous vaginal delivery              Discharge Diagnosis: No other diagnosis   Intrapartum Procedures: augmentation    Post partum procedures: none  Complications: none                      Discharge Day SOAP Note:  Progress Note - Vaginal Delivery  Morgan Mann is a 33 y.o. [id number] now PP day 1 s/p Vaginal, Spontaneous . Delivery was uncomplicated  Subjective  The patient has the following complaints: has no unusual complaints  Pain is controlled with current medications.   Patient is urinating without difficulty.  She is ambulating well.     Objective  Vital signs: BP 125/79 (BP Location: Right Arm)   Pulse 63   Temp 98.5 F (36.9 C) (Oral)   Resp 20   Ht 5\' 8"  (1.727 m)   Wt 113.9 kg   LMP 10/19/2018   SpO2 100%   Breastfeeding Unknown   BMI 38.16 kg/m   Physical Exam: Gen: NAD Fundus Fundal Tone: Firm  Lochia Amount: Scant  Perineum Appearance: Approximated     Data Review Labs: CBC Latest Ref Rng & Units 07/16/2019 07/14/2019 05/08/2019  WBC 4.0 - 10.5 K/uL 8.4 6.4 5.6  Hemoglobin 12.0 - 15.0 g/dL 10.4(L) 11.1(L) 10.7(L)  Hematocrit 36.0 - 46.0 % 31.2(L) 32.5(L) 30.8(L)  Platelets 150 - 400 K/uL 278 283 302   O POS  Assessment/Plan  Active Problems:   Pregnancy   Full-term premature rupture of membranes    Plan for discharge today.   Discharge Instructions: Per After Visit Summary. Activity: Advance as tolerated. Pelvic rest for 6 weeks.  Also refer to After Visit Summary Diet: Regular Medications: Allergies as of 07/16/2019   No Known Allergies     Medication List    TAKE these medications   acetaminophen 500 MG tablet Commonly known as: TYLENOL Take 500 mg by mouth every 6 (six) hours as needed.   ferrous sulfate 325 (65 FE) MG tablet Take 325 mg by mouth  every other day.   ibuprofen 600 MG tablet Commonly known as: ADVIL Take 1 tablet (600 mg total) by mouth every 6 (six) hours.   prenatal multivitamin Tabs tablet Take 1 tablet by mouth daily at 12 noon.      Outpatient follow up:  Follow-up Information    Diona Fanti, CNM. Schedule an appointment as soon as possible for a visit in 6 week(s).   Specialties: Certified Nurse Midwife, Obstetrics and Gynecology, Radiology Why: Please call to schedule six (6) week postpartum visit Contact information: [place] [address] [phone]          Postpartum contraception: Mirena IUD Discharged Condition: good  Discharged to: home  Newborn Data: Disposition:home with mother  Apgars: APGAR (1 MIN): 8   APGAR (5 MINS): 9   APGAR (10 MINS):    Baby Feeding: Breast    Philip Aspen, CNM  07/16/2019 8:21 AM

## 2019-07-17 ENCOUNTER — Encounter: Payer: PRIVATE HEALTH INSURANCE | Admitting: Certified Nurse Midwife

## 2019-07-19 ENCOUNTER — Telehealth: Payer: Self-pay

## 2019-07-19 NOTE — Telephone Encounter (Signed)
Called and spoke with patient.  Patient states breast feeding "is going wonderful" and declines Lactation consult.  Patient aware she can call back and request if she desires to have consult at a later date, patient verbalized understanding.

## 2019-08-27 ENCOUNTER — Encounter: Payer: Self-pay | Admitting: Certified Nurse Midwife

## 2019-08-27 ENCOUNTER — Ambulatory Visit (INDEPENDENT_AMBULATORY_CARE_PROVIDER_SITE_OTHER): Payer: Medicaid Other | Admitting: Certified Nurse Midwife

## 2019-08-27 ENCOUNTER — Other Ambulatory Visit: Payer: Self-pay

## 2019-08-27 NOTE — Patient Instructions (Signed)
Preventive Care 21-35 Years Old, Female Preventive care refers to visits with your health care provider and lifestyle choices that can promote health and wellness. This includes:  A yearly physical exam. This may also be called an annual well check.  Regular dental visits and eye exams.  Immunizations.  Screening for certain conditions.  Healthy lifestyle choices, such as eating a healthy diet, getting regular exercise, not using drugs or products that contain nicotine and tobacco, and limiting alcohol use. What can I expect for my preventive care visit? Physical exam Your health care provider will check your:  Height and weight. This may be used to calculate body mass index (BMI), which tells if you are at a healthy weight.  Heart rate and blood pressure.  Skin for abnormal spots. Counseling Your health care provider may ask you questions about your:  Alcohol, tobacco, and drug use.  Emotional well-being.  Home and relationship well-being.  Sexual activity.  Eating habits.  Work and work environment.  Method of birth control.  Menstrual cycle.  Pregnancy history. What immunizations do I need?  Influenza (flu) vaccine  This is recommended every year. Tetanus, diphtheria, and pertussis (Tdap) vaccine  You may need a Td booster every 10 years. Varicella (chickenpox) vaccine  You may need this if you have not been vaccinated. Human papillomavirus (HPV) vaccine  If recommended by your health care provider, you may need three doses over 6 months. Measles, mumps, and rubella (MMR) vaccine  You may need at least one dose of MMR. You may also need a second dose. Meningococcal conjugate (MenACWY) vaccine  One dose is recommended if you are age 19-21 years and a first-year college student living in a residence hall, or if you have one of several medical conditions. You may also need additional booster doses. Pneumococcal conjugate (PCV13) vaccine  You may need  this if you have certain conditions and were not previously vaccinated. Pneumococcal polysaccharide (PPSV23) vaccine  You may need one or two doses if you smoke cigarettes or if you have certain conditions. Hepatitis A vaccine  You may need this if you have certain conditions or if you travel or work in places where you may be exposed to hepatitis A. Hepatitis B vaccine  You may need this if you have certain conditions or if you travel or work in places where you may be exposed to hepatitis B. Haemophilus influenzae type b (Hib) vaccine  You may need this if you have certain conditions. You may receive vaccines as individual doses or as more than one vaccine together in one shot (combination vaccines). Talk with your health care provider about the risks and benefits of combination vaccines. What tests do I need?  Blood tests  Lipid and cholesterol levels. These may be checked every 5 years starting at age 20.  Hepatitis C test.  Hepatitis B test. Screening  Diabetes screening. This is done by checking your blood sugar (glucose) after you have not eaten for a while (fasting).  Sexually transmitted disease (STD) testing.  BRCA-related cancer screening. This may be done if you have a family history of breast, ovarian, tubal, or peritoneal cancers.  Pelvic exam and Pap test. This may be done every 3 years starting at age 21. Starting at age 30, this may be done every 5 years if you have a Pap test in combination with an HPV test. Talk with your health care provider about your test results, treatment options, and if necessary, the need for more tests.   Follow these instructions at home: Eating and drinking   Eat a diet that includes fresh fruits and vegetables, whole grains, lean protein, and low-fat dairy.  Take vitamin and mineral supplements as recommended by your health care provider.  Do not drink alcohol if: ? Your health care provider tells you not to drink. ? You are  pregnant, may be pregnant, or are planning to become pregnant.  If you drink alcohol: ? Limit how much you have to 0-1 drink a day. ? Be aware of how much alcohol is in your drink. In the [place] one drink equals one 12 oz bottle of beer (355 mL), one 5 oz glass of wine (148 mL), or one 1 oz glass of hard liquor (44 mL). Lifestyle  Take daily care of your teeth and gums.  Stay active. Exercise for at least 30 minutes on 5 or more days each week.  Do not use any products that contain nicotine or tobacco, such as cigarettes, e-cigarettes, and chewing tobacco. If you need help quitting, ask your health care provider.  If you are sexually active, practice safe sex. Use a condom or other form of birth control (contraception) in order to prevent pregnancy and STIs (sexually transmitted infections). If you plan to become pregnant, see your health care provider for a preconception visit. What's next?  Visit your health care provider once a year for a well check visit.  Ask your health care provider how often you should have your eyes and teeth checked.  Stay up to date on all vaccines. This information is not intended to replace advice given to you by your health care provider. Make sure you discuss any questions you have with your health care provider. Document Released: 01/18/2002 Document Revised: 08/03/2018 Document Reviewed: 08/03/2018 Elsevier Patient Education  [place]. Levonorgestrel intrauterine device (IUD) What is this medicine? LEVONORGESTREL IUD (LEE voe nor jes trel) is a contraceptive (birth control) device. The device is placed inside the uterus by a healthcare professional. It is used to prevent pregnancy. This device can also be used to treat heavy bleeding that occurs during your period. This medicine may be used for other purposes; ask your health care provider or pharmacist if you have questions. COMMON BRAND NAME(S): Minette [place]land What should I  tell my health care provider before I take this medicine? They need to know if you have any of these conditions:  abnormal Pap smear  cancer of the breast, uterus, or cervix  diabetes  endometritis  genital or pelvic infection now or in the past  have more than one sexual partner or your partner has more than one partner  heart disease  history of an ectopic or tubal pregnancy  immune system problems  IUD in place  liver disease or tumor  problems with blood clots or take blood-thinners  seizures  use intravenous drugs  uterus of unusual shape  vaginal bleeding that has not been explained  an unusual or allergic reaction to levonorgestrel, other hormones, silicone, or polyethylene, medicines, foods, dyes, or preservatives  pregnant or trying to get pregnant  breast-feeding How should I use this medicine? This device is placed inside the uterus by a health care professional. Talk to your pediatrician regarding the use of this medicine in children. Special care may be needed. Overdosage: If you think you have taken too much of this medicine contact a poison control center or emergency room at once. NOTE: This medicine is only for you. Do not  share this medicine with others. What if I miss a dose? This does not apply. Depending on the brand of device you have inserted, the device will need to be replaced every 3 to 6 years if you wish to continue using this type of birth control. What may interact with this medicine? Do not take this medicine with any of the following medications:  amprenavir  bosentan  fosamprenavir This medicine may also interact with the following medications:  aprepitant  armodafinil  barbiturate medicines for inducing sleep or treating seizures  bexarotene  boceprevir  griseofulvin  medicines to treat seizures like carbamazepine, ethotoin, felbamate, oxcarbazepine, phenytoin, topiramate  modafinil  pioglitazone  rifabutin   rifampin  rifapentine  some medicines to treat HIV infection like atazanavir, efavirenz, indinavir, lopinavir, nelfinavir, tipranavir, ritonavir  St. John's wort  warfarin This list may not describe all possible interactions. Give your health care provider a list of all the medicines, herbs, non-prescription drugs, or dietary supplements you use. Also tell them if you smoke, drink alcohol, or use illegal drugs. Some items may interact with your medicine. What should I watch for while using this medicine? Visit your doctor or health care professional for regular check ups. See your doctor if you or your partner has sexual contact with others, becomes HIV positive, or gets a sexual transmitted disease. This product does not protect you against HIV infection (AIDS) or other sexually transmitted diseases. You can check the placement of the IUD yourself by reaching up to the top of your vagina with clean fingers to feel the threads. Do not pull on the threads. It is a good habit to check placement after each menstrual period. Call your doctor right away if you feel more of the IUD than just the threads or if you cannot feel the threads at all. The IUD may come out by itself. You may become pregnant if the device comes out. If you notice that the IUD has come out use a backup birth control method like condoms and call your health care provider. Using tampons will not change the position of the IUD and are okay to use during your period. This IUD can be safely scanned with magnetic resonance imaging (MRI) only under specific conditions. Before you have an MRI, tell your healthcare provider that you have an IUD in place, and which type of IUD you have in place. What side effects may I notice from receiving this medicine? Side effects that you should report to your doctor or health care professional as soon as possible:  allergic reactions like skin rash, itching or hives, swelling of the face, lips,  or tongue  fever, flu-like symptoms  genital sores  high blood pressure  no menstrual period for 6 weeks during use  pain, swelling, warmth in the leg  pelvic pain or tenderness  severe or sudden headache  signs of pregnancy  stomach cramping  sudden shortness of breath  trouble with balance, talking, or walking  unusual vaginal bleeding, discharge  yellowing of the eyes or skin Side effects that usually do not require medical attention (report to your doctor or health care professional if they continue or are bothersome):  acne  breast pain  change in sex drive or performance  changes in weight  cramping, dizziness, or faintness while the device is being inserted  headache  irregular menstrual bleeding within first 3 to 6 months of use  nausea This list may not describe all possible side effects. Call your doctor  for medical advice about side effects. You may report side effects to FDA at 1-800-FDA-1088. Where should I keep my medicine? This does not apply. NOTE: This sheet is a summary. It may not cover all possible information. If you have questions about this medicine, talk to your doctor, pharmacist, or health care provider.  2020 Elsevier/Gold Standard (2018-10-03 13:22:01)  

## 2019-08-27 NOTE — Progress Notes (Signed)
Subjective:    Morgan Mann is a 33 y.o. [phone] African American female who presents for a postpartum visit. She is 6 weeks postpartum following a spontaneous vaginal delivery at 38+3 gestational weeks. Anesthesia: none. I have fully reviewed the prenatal and intrapartum course.   Postpartum course has been uncomplicated. Baby's course has been uncomplicated. Baby is feeding by breast. Returning to work on Wednesday. Has hand pump and electric dual Evenflo pump.  Bleeding no bleeding. Bowel function is normal. Bladder function is normal.   Patient is not sexually active. Contraception method is abstinence, desires Mirena. Postpartum depression screening: negative. Score 0.  Last pap 02/2019 and was Negative/Negative.  Denies difficulty breathing or respiratory distress, chest pain, abdominal pain, excessive vaginal bleeding, dysuria, and leg pain or swelling.   The following portions of the patient's history were reviewed and updated as appropriate: allergies, current medications, past medical history, past surgical history and problem list.  Review of Systems  Pertinent items are noted in HPI. Information obtained from patient.   Objective:   BP 106/76   Pulse 65   Ht 5\' 8"  (1.727 m)   Wt 239 lb 14.4 oz (108.8 kg)   LMP 08/27/2019 (Exact Date)   Breastfeeding Yes   BMI 36.48 kg/m   General:  alert, cooperative and no distress   Breasts:  deferred, no complaints  Lungs: clear to auscultation bilaterally  Heart:  regular rate and rhythm  Abdomen: soft, nontender   Vulva: normal  Vagina: normal vagina  Cervix:  closed  Corpus: Well-involuted  Adnexa:  Non-palpable   Depression screen [facility] 2/9 08/27/2019  Decreased Interest 0  Down, Depressed, Hopeless 0  PHQ - 2 Score 0  Altered sleeping 0  Tired, decreased energy 0  Change in appetite 0  Feeling bad or failure about yourself  0  Trouble concentrating 0  Moving slowly or fidgety/restless 0  Suicidal thoughts 0  PHQ-9  Score 0        Assessment:   Postpartum exam Six (6) wks s/p spontaneous vaginal birth Breastfeeding Depression screening Contraception counseling   Plan:   Encouraged routine health maintenance techniques.   May return to work without restriction.   Reviewed red flag symptoms and when to call.   Follow up in: 2 weeks for Mirena IUD insertion or earlier if needed.   Diona Fanti, CNM [facility], [facility] [place] 08/27/19 9:55 AM

## 2019-08-27 NOTE — Progress Notes (Signed)
Patient here for post-partum visit, no complaints.  

## 2019-09-17 ENCOUNTER — Ambulatory Visit (INDEPENDENT_AMBULATORY_CARE_PROVIDER_SITE_OTHER): Payer: Medicaid Other | Admitting: Certified Nurse Midwife

## 2019-09-17 ENCOUNTER — Other Ambulatory Visit: Payer: Self-pay

## 2019-09-17 ENCOUNTER — Encounter: Payer: Self-pay | Admitting: Certified Nurse Midwife

## 2019-09-17 VITALS — BP 99/62 | HR 74 | Ht 68.0 in | Wt 235.2 lb

## 2019-09-17 DIAGNOSIS — Z30430 Encounter for insertion of intrauterine contraceptive device: Secondary | ICD-10-CM | POA: Diagnosis not present

## 2019-09-17 DIAGNOSIS — Z3202 Encounter for pregnancy test, result negative: Secondary | ICD-10-CM | POA: Diagnosis not present

## 2019-09-17 LAB — POCT URINE PREGNANCY: Preg Test, Ur: NEGATIVE

## 2019-09-17 NOTE — Patient Instructions (Signed)
IUD PLACEMENT POST-PROCEDURE INSTRUCTIONS  1. You may take Ibuprofen, Aleve or Tylenol for pain if needed.  Cramping should resolve within in 24 hours.  2. You may have a small amount of spotting.  You should wear a mini pad for the next few days.  3. You may have intercourse after 72 hours.  If you using this for birth control, it is effective immediately.  4. You need to call if you have any pelvic pain, fever, heavy bleeding or foul smelling vaginal discharge.  Irregular bleeding is common the first several months after having an IUD placed. You do not need to call for this reason unless you are concerned.  5. Shower or bathe as normal  6. You should have a follow-up appointment in 4-8 weeks for a re-check to make sure you are not having any problems.   Levonorgestrel intrauterine device (IUD) What is this medicine? LEVONORGESTREL IUD (LEE voe nor jes trel) is a contraceptive (birth control) device. The device is placed inside the uterus by a healthcare professional. It is used to prevent pregnancy. This device can also be used to treat heavy bleeding that occurs during your period. This medicine may be used for other purposes; ask your health care provider or pharmacist if you have questions. COMMON BRAND NAME(S): Kyleena, LILETTA, Mirena, Skyla What should I tell my health care provider before I take this medicine? They need to know if you have any of these conditions:  abnormal Pap smear  cancer of the breast, uterus, or cervix  diabetes  endometritis  genital or pelvic infection now or in the past  have more than one sexual partner or your partner has more than one partner  heart disease  history of an ectopic or tubal pregnancy  immune system problems  IUD in place  liver disease or tumor  problems with blood clots or take blood-thinners  seizures  use intravenous drugs  uterus of unusual shape  vaginal bleeding that has not been explained  an unusual or  allergic reaction to levonorgestrel, other hormones, silicone, or polyethylene, medicines, foods, dyes, or preservatives  pregnant or trying to get pregnant  breast-feeding How should I use this medicine? This device is placed inside the uterus by a health care professional. Talk to your pediatrician regarding the use of this medicine in children. Special care may be needed. Overdosage: If you think you have taken too much of this medicine contact a poison control center or emergency room at once. NOTE: This medicine is only for you. Do not share this medicine with others. What if I miss a dose? This does not apply. Depending on the brand of device you have inserted, the device will need to be replaced every 3 to 6 years if you wish to continue using this type of birth control. What may interact with this medicine? Do not take this medicine with any of the following medications:  amprenavir  bosentan  fosamprenavir This medicine may also interact with the following medications:  aprepitant  armodafinil  barbiturate medicines for inducing sleep or treating seizures  bexarotene  boceprevir  griseofulvin  medicines to treat seizures like carbamazepine, ethotoin, felbamate, oxcarbazepine, phenytoin, topiramate  modafinil  pioglitazone  rifabutin  rifampin  rifapentine  some medicines to treat HIV infection like atazanavir, efavirenz, indinavir, lopinavir, nelfinavir, tipranavir, ritonavir  St. John's wort  warfarin This list may not describe all possible interactions. Give your health care provider a list of all the medicines, herbs, non-prescription drugs, or   dietary supplements you use. Also tell them if you smoke, drink alcohol, or use illegal drugs. Some items may interact with your medicine. What should I watch for while using this medicine? Visit your doctor or health care professional for regular check ups. See your doctor if you or your partner has sexual  contact with others, becomes HIV positive, or gets a sexual transmitted disease. This product does not protect you against HIV infection (AIDS) or other sexually transmitted diseases. You can check the placement of the IUD yourself by reaching up to the top of your vagina with clean fingers to feel the threads. Do not pull on the threads. It is a good habit to check placement after each menstrual period. Call your doctor right away if you feel more of the IUD than just the threads or if you cannot feel the threads at all. The IUD may come out by itself. You may become pregnant if the device comes out. If you notice that the IUD has come out use a backup birth control method like condoms and call your health care provider. Using tampons will not change the position of the IUD and are okay to use during your period. This IUD can be safely scanned with magnetic resonance imaging (MRI) only under specific conditions. Before you have an MRI, tell your healthcare provider that you have an IUD in place, and which type of IUD you have in place. What side effects may I notice from receiving this medicine? Side effects that you should report to your doctor or health care professional as soon as possible:  allergic reactions like skin rash, itching or hives, swelling of the face, lips, or tongue  fever, flu-like symptoms  genital sores  high blood pressure  no menstrual period for 6 weeks during use  pain, swelling, warmth in the leg  pelvic pain or tenderness  severe or sudden headache  signs of pregnancy  stomach cramping  sudden shortness of breath  trouble with balance, talking, or walking  unusual vaginal bleeding, discharge  yellowing of the eyes or skin Side effects that usually do not require medical attention (report to your doctor or health care professional if they continue or are bothersome):  acne  breast pain  change in sex drive or performance  changes in  weight  cramping, dizziness, or faintness while the device is being inserted  headache  irregular menstrual bleeding within first 3 to 6 months of use  nausea This list may not describe all possible side effects. Call your doctor for medical advice about side effects. You may report side effects to FDA at 1-800-FDA-1088. Where should I keep my medicine? This does not apply. NOTE: This sheet is a summary. It may not cover all possible information. If you have questions about this medicine, talk to your doctor, pharmacist, or health care provider.  2020 Elsevier/Gold Standard (2018-10-03 13:22:01)  

## 2019-09-17 NOTE — Progress Notes (Signed)
Morgan Mann is a 33 y.o. year old [id number] African American female who presents for placement of a Mirena IUD.  Patient's last menstrual period was 08/27/2019 (exact date). LMP 08/27/2019 (Exact Date)    Last sexual intercourse was this morning, and pregnancy test today was negative.  The risks and benefits of the method and placement have been thouroughly reviewed with the patient and all questions were answered.  Specifically the patient is aware of failure rate of 12/998, expulsion of the IUD and of possible perforation.  The patient is aware of irregular bleeding due to the method and understands the incidence of irregular bleeding diminishes with time.  Signed copy of informed consent in chart.   Time out was performed.  A medium plastic speculum was placed in the vagina.  The cervix was visualized, prepped using Betadine, and grasped with a single tooth tenaculum. The uterus was sounded to 8 cm.  Mirena IUD placed per manufacturer's recommendations.   The strings were trimmed to 3 cm. Bleeding from the tenaculum site was managed with silver nitrate.   The patient was given post procedure instructions, including signs and symptoms of infection and to check for the strings after each menses or each month, and refraining from intercourse or anything in the vagina for 3 days.  She was given a Mirena care card with date Mirena placed, and date Mirena to be removed.  Reviewed red flag symptoms and when to call.   RTC x 4-8 weeks for IUD string check or sooner if needed.    Diona Fanti, CNM [facility], [facility] 09/17/19 12:00 PM   [place]: [id number] Lot: [id number] Exp: 10/2021

## 2019-10-25 ENCOUNTER — Encounter: Payer: Medicaid Other | Admitting: Certified Nurse Midwife

## 2019-12-27 ENCOUNTER — Ambulatory Visit: Payer: Medicaid Other | Attending: Internal Medicine

## 2019-12-27 DIAGNOSIS — Z20822 Contact with and (suspected) exposure to covid-19: Secondary | ICD-10-CM

## 2019-12-28 LAB — NOVEL CORONAVIRUS, NAA: SARS-CoV-2, NAA: NOT DETECTED

## 2020-01-15 ENCOUNTER — Ambulatory Visit (INDEPENDENT_AMBULATORY_CARE_PROVIDER_SITE_OTHER): Payer: Medicaid Other | Admitting: Certified Nurse Midwife

## 2020-01-15 ENCOUNTER — Encounter: Payer: Self-pay | Admitting: Certified Nurse Midwife

## 2020-01-15 ENCOUNTER — Other Ambulatory Visit: Payer: Self-pay

## 2020-01-15 VITALS — BP 120/77 | HR 79 | Ht 68.0 in | Wt 251.4 lb

## 2020-01-15 DIAGNOSIS — Z23 Encounter for immunization: Secondary | ICD-10-CM

## 2020-01-15 DIAGNOSIS — Z30432 Encounter for removal of intrauterine contraceptive device: Secondary | ICD-10-CM | POA: Diagnosis not present

## 2020-01-15 MED ORDER — NORETHIN ACE-ETH ESTRAD-FE 1-20 MG-MCG PO TABS: 1.0000 | ORAL_TABLET | Freq: Every day | ORAL | 11 refills | Status: DC

## 2020-01-15 NOTE — Progress Notes (Signed)
  GYNECOLOGY OFFICE PROCEDURE NOTE  Morgan Mann is a 34 y.o. [phone] here for Mirena IUD removal. No GYN concerns.  Last pap smear was on 02/28/19  and was normal.  IUD Removal  Patient identified, informed consent performed, consent signed.  Patient was in the dorsal lithotomy position, normal external genitalia was noted.  A speculum was placed in the patient's vagina, normal discharge was noted, no lesions. The cervix was visualized, no lesions, no abnormal discharge.  The strings of the IUD were grasped and pulled using ring forceps. The IUD was removed in its entirety. Patient tolerated the procedure well.    Patient will use pill for contraception She denies any contraindications for use. Advised back up for 2 wks . Routine preventative health maintenance measures emphasized.   Doreene Burke, CNM

## 2020-01-15 NOTE — Patient Instructions (Signed)

## 2020-08-27 ENCOUNTER — Telehealth: Payer: Self-pay | Admitting: Certified Nurse Midwife

## 2020-08-27 ENCOUNTER — Encounter: Payer: Self-pay | Admitting: Certified Nurse Midwife

## 2020-08-27 ENCOUNTER — Ambulatory Visit (INDEPENDENT_AMBULATORY_CARE_PROVIDER_SITE_OTHER): Payer: Medicaid Other | Admitting: Certified Nurse Midwife

## 2020-08-27 ENCOUNTER — Other Ambulatory Visit: Payer: Self-pay

## 2020-08-27 VITALS — BP 119/73 | HR 70 | Ht 68.0 in | Wt 259.1 lb

## 2020-08-27 DIAGNOSIS — N912 Amenorrhea, unspecified: Secondary | ICD-10-CM | POA: Diagnosis not present

## 2020-08-27 LAB — POCT URINE PREGNANCY: Preg Test, Ur: POSITIVE — AB

## 2020-08-27 NOTE — Telephone Encounter (Signed)
This pt was checking out and she saw the water brith sign and she wanted to know if it was still going on. I told her yes it is. She is requesting more information. I told her that I will send a message to Marcelino Duster she is our Water birth [place]. The pt stated that she can send it thur mychart.

## 2020-08-27 NOTE — Progress Notes (Signed)
Subjective:    Morgan Mann is a 34 y.o. female who presents for evaluation of amenorrhea. She believes she could be pregnant. was not planned but is desired Sexual Activity: single partner, contraception: none. Current symptoms also include: fatigue and nausea. Last period was normal.   No LMP recorded. The following portions of the patient's history were reviewed and updated as appropriate: allergies, current medications, past family history, past medical history, past social history, past surgical history and problem list.  Review of Systems Pertinent items are noted in HPI.     Objective:    There were no vitals taken for this visit. General: alert, cooperative, appears stated age, no distress and no acute distress    Lab Review Urine HCG: positive    Assessment:    Absence of menstruation.     Plan:   Positive: EDC: 04/14/21 Briefly discussed pre-natal care options.Md or midwifery care reviewed. Plans to see Midwives. . Encouraged well-balanced diet, plenty of rest when needed, pre-natal vitamins daily and walking for exercise. Discussed self-help for nausea, avoiding OTC medications until consulting provider or pharmacist, other than Tylenol as needed, minimal caffeine (1-2 cups daily) and avoiding alcohol. She will schedule her u/s for dating 1 wk, her nurse visit @ 10 wks ,  initial NOB visit @ 12 wks. Feel free to call with any questions.   Doreene Burke, CNM

## 2020-08-28 ENCOUNTER — Other Ambulatory Visit: Payer: Self-pay

## 2020-08-28 MED ORDER — DOXYLAMINE-PYRIDOXINE 10-10 MG PO TBEC: 2.0000 | DELAYED_RELEASE_TABLET | Freq: Every day | ORAL | 5 refills | Status: DC

## 2020-09-03 NOTE — Telephone Encounter (Signed)
Please contact patient may discuss with Pattricia Boss at [facility] and further with me at next visit. Thanks, JML

## 2020-09-04 ENCOUNTER — Ambulatory Visit (INDEPENDENT_AMBULATORY_CARE_PROVIDER_SITE_OTHER): Payer: Medicaid Other

## 2020-09-04 ENCOUNTER — Other Ambulatory Visit: Payer: Self-pay

## 2020-09-04 DIAGNOSIS — N912 Amenorrhea, unspecified: Secondary | ICD-10-CM

## 2020-09-15 ENCOUNTER — Other Ambulatory Visit: Payer: Self-pay

## 2020-09-15 ENCOUNTER — Ambulatory Visit (INDEPENDENT_AMBULATORY_CARE_PROVIDER_SITE_OTHER): Payer: Medicaid Other | Admitting: Surgical

## 2020-09-15 VITALS — BP 115/71 | HR 66 | Ht 68.0 in | Wt 254.0 lb

## 2020-09-15 DIAGNOSIS — Z3491 Encounter for supervision of normal pregnancy, unspecified, first trimester: Secondary | ICD-10-CM | POA: Diagnosis not present

## 2020-09-15 NOTE — Patient Instructions (Signed)
First Trimester of Pregnancy  The first trimester of pregnancy is from week 1 until the end of week 13 (months 1 through 3). During this time, your baby will begin to develop inside you. At 6-8 weeks, the eyes and face are formed, and the heartbeat can be seen on ultrasound. At the end of 12 weeks, all the baby's organs are formed. Prenatal care is all the medical care you receive before the birth of your baby. Make sure you get good prenatal care and follow all of your doctor's instructions. Follow these instructions at home: Medicines  Take over-the-counter and prescription medicines only as told by your doctor. Some medicines are safe and some medicines are not safe during pregnancy.  Take a prenatal vitamin that contains at least 600 micrograms (mcg) of folic acid.  If you have trouble pooping (constipation), take medicine that will make your stool soft (stool softener) if your doctor approves. Eating and drinking   Eat regular, healthy meals.  Your doctor will tell you the amount of weight gain that is right for you.  Avoid raw meat and uncooked cheese.  If you feel sick to your stomach (nauseous) or throw up (vomit): ? Eat 4 or 5 small meals a day instead of 3 large meals. ? Try eating a few soda crackers. ? Drink liquids between meals instead of during meals.  To prevent constipation: ? Eat foods that are high in fiber, like fresh fruits and vegetables, whole grains, and beans. ? Drink enough fluids to keep your pee (urine) clear or pale yellow. Activity  Exercise only as told by your doctor. Stop exercising if you have cramps or pain in your lower belly (abdomen) or low back.  Do not exercise if it is too hot, too humid, or if you are in a place of great height (high altitude).  Try to avoid standing for long periods of time. Move your legs often if you must stand in one place for a long time.  Avoid heavy lifting.  Wear low-heeled shoes. Sit and stand up  straight.  You can have sex unless your doctor tells you not to. Relieving pain and discomfort  Wear a good support bra if your breasts are sore.  Take warm water baths (sitz baths) to soothe pain or discomfort caused by hemorrhoids. Use hemorrhoid cream if your doctor says it is okay.  Rest with your legs raised if you have leg cramps or low back pain.  If you have puffy, bulging veins (varicose veins) in your legs: ? Wear support hose or compression stockings as told by your doctor. ? Raise (elevate) your feet for 15 minutes, 3-4 times a day. ? Limit salt in your food. Prenatal care  Schedule your prenatal visits by the twelfth week of pregnancy.  Write down your questions. Take them to your prenatal visits.  Keep all your prenatal visits as told by your doctor. This is important. Safety  Wear your seat belt at all times when driving.  Make a list of emergency phone numbers. The list should include numbers for family, friends, the hospital, and police and fire departments. General instructions  Ask your doctor for a referral to a local prenatal class. Begin classes no later than at the start of month 6 of your pregnancy.  Ask for help if you need counseling or if you need help with nutrition. Your doctor can give you advice or tell you where to go for help.  Do not use hot tubs, steam   rooms, or saunas.  Do not douche or use tampons or scented sanitary pads.  Do not cross your legs for long periods of time.  Avoid all herbs and alcohol. Avoid drugs that are not approved by your doctor.  Do not use any tobacco products, including cigarettes, chewing tobacco, and electronic cigarettes. If you need help quitting, ask your doctor. You may get counseling or other support to help you quit.  Avoid cat litter boxes and soil used by cats. These carry germs that can cause birth defects in the baby and can cause a loss of your baby (miscarriage) or stillbirth.  Visit your dentist.  At home, brush your teeth with a soft toothbrush. Be gentle when you floss. Contact a doctor if:  You are dizzy.  You have mild cramps or pressure in your lower belly.  You have a nagging pain in your belly area.  You continue to feel sick to your stomach, you throw up, or you have watery poop (diarrhea).  You have a bad smelling fluid coming from your vagina.  You have pain when you pee (urinate).  You have increased puffiness (swelling) in your face, hands, legs, or ankles. Get help right away if:  You have a fever.  You are leaking fluid from your vagina.  You have spotting or bleeding from your vagina.  You have very bad belly cramping or pain.  You gain or lose weight rapidly.  You throw up blood. It may look like coffee grounds.  You are around people who have [place] measles, fifth disease, or chickenpox.  You have a very bad headache.  You have shortness of breath.  You have any kind of trauma, such as from a fall or a car accident. Summary  The first trimester of pregnancy is from week 1 until the end of week 13 (months 1 through 3).  To take care of yourself and your unborn baby, you will need to eat healthy meals, take medicines only if your doctor tells you to do so, and do activities that are safe for you and your baby.  Keep all follow-up visits as told by your doctor. This is important as your doctor will have to ensure that your baby is healthy and growing well. This information is not intended to replace advice given to you by your health care provider. Make sure you discuss any questions you have with your health care provider. Document Revised: 03/15/2019 Document Reviewed: 11/30/2016 Elsevier Patient Education  [place].   Common Medications Safe in Pregnancy  Acne:      Constipation:  Benzoyl Peroxide     Colace  Clindamycin      Dulcolax Suppository  Topica Erythromycin     Fibercon  Salicylic  Acid      Metamucil         Miralax AVOID:        Senakot   Accutane    Cough:  Retin-A       Cough Drops  Tetracycline      Phenergan w/ Codeine if Rx  Minocycline      Robitussin (Plain & DM)  Antibiotics:     Crabs/Lice:  Ceclor       RID  Cephalosporins    AVOID:  E-Mycins      Kwell  Keflex  Macrobid/Macrodantin   Diarrhea:  Penicillin      Kao-Pectate  Zithromax      Imodium AD         PUSH  FLUIDS AVOID:       Cipro     Fever:  Tetracycline      Tylenol (Regular or Extra  Minocycline       Strength)  Levaquin      Extra Strength-Do not          Exceed 8 tabs/24 hrs Caffeine:        <266m/day (equiv. To 1 cup of coffee or  approx. 3 12 oz sodas)         Gas: Cold/Hayfever:       Gas-X  Benadryl      Mylicon  Claritin       Phazyme  **Claritin-D        Chlor-Trimeton    Headaches:  Dimetapp      ASA-Free Excedrin  Drixoral-Non-Drowsy     Cold Compress  Mucinex (Guaifenasin)     Tylenol (Regular or Extra  Sudafed/Sudafed-12 Hour     Strength)  **Sudafed PE Pseudoephedrine   Tylenol Cold & Sinus     Vicks Vapor Rub  Zyrtec  **AVOID if Problems With Blood Pressure         Heartburn: Avoid lying down for at least 1 hour after meals  Aciphex      Maalox     Rash:  Milk of Magnesia     Benadryl    Mylanta       1% Hydrocortisone Cream  Pepcid  Pepcid Complete   Sleep Aids:  Prevacid      Ambien   Prilosec       Benadryl  Rolaids       Chamomile Tea  Tums (Limit 4/day)     Unisom         Tylenol PM         Warm milk-add vanilla or  Hemorrhoids:       Sugar for taste  Anusol/Anusol H.C.  (RX: Analapram 2.5%)  Sugar Substitutes:  Hydrocortisone OTC     Ok in moderation  Preparation H      Tucks        Vaseline lotion applied to tissue with wiping    Herpes:     Throat:  Acyclovir      Oragel  Famvir  Valtrex     Vaccines:         Flu Shot Leg Cramps:       *Gardasil  Benadryl      Hepatitis A         Hepatitis B Nasal  Spray:       Pneumovax  Saline Nasal Spray     Polio Booster         Tetanus Nausea:       Tuberculosis test or PPD  Vitamin B6 25 mg TID   AVOID:    Dramamine      *Gardasil  Emetrol       Live Poliovirus  Ginger Root 250 mg QID    MMR (measles, mumps &  High Complex Carbs @ Bedtime    rebella)  Sea Bands-Accupressure    Varicella (Chickenpox)  Unisom 1/2 tab TID     *No known complications           If received before Pain:         Known pregnancy;   Darvocet       Resume series after  Lortab        Delivery  Percocet    Yeast:   Tramadol  Femstat  Tylenol 3      Gyne-lotrimin  Ultram       Monistat  Vicodin           MISC:         All Sunscreens           Hair Coloring/highlights          Insect Repellant's          (Including DEET)         Mystic Tans  

## 2020-09-15 NOTE — Progress Notes (Signed)
Morgan Mann presents for NOB nurse interview visit. Pregnancy confirmation done 08/27/2020. G6. [id number]. Pregnancy education material explained and given. 0 cats in home. NOB labs ordered. TSH/HbgA1c ordered due to BMI 30 or greater. Sickle cell ordered due to patient's race. HIV labs and drug screen were explained and ordered. PNV encouraged. Genetic screening options discussed. Genetic testing: Unsure. Patient may discuss with the provider. Patient to follow up with provider on 10/01/2020 weeks for NOB physical. All questions answered.  Patient would like more information on water birth. I told her that Pattricia Boss would talk to her more about water birth at new OB physical.

## 2020-09-16 LAB — URINALYSIS, ROUTINE W REFLEX MICROSCOPIC
Bilirubin, UA: NEGATIVE
Glucose, UA: NEGATIVE
Ketones, UA: NEGATIVE
Leukocytes,UA: NEGATIVE
Nitrite, UA: NEGATIVE
Protein,UA: NEGATIVE
RBC, UA: NEGATIVE
Specific Gravity, UA: 1.013 (ref 1.005–1.030)
Urobilinogen, Ur: 0.2 mg/dL (ref 0.2–1.0)
pH, UA: 6.5 (ref 5.0–7.5)

## 2020-09-16 LAB — HGB SOLU + RFLX FRAC: Sickle Solubility Test - HGBRFX: NEGATIVE

## 2020-09-16 LAB — HEPATITIS B SURFACE ANTIGEN: Hepatitis B Surface Ag: NEGATIVE

## 2020-09-16 LAB — ABO AND RH: Rh Factor: POSITIVE

## 2020-09-16 LAB — HIV ANTIBODY (ROUTINE TESTING W REFLEX): HIV Screen 4th Generation wRfx: NONREACTIVE

## 2020-09-16 LAB — HEMOGLOBIN A1C
Est. average glucose Bld gHb Est-mCnc: 100 mg/dL
Hgb A1c MFr Bld: 5.1 % (ref 4.8–5.6)

## 2020-09-16 LAB — RPR: RPR Ser Ql: NONREACTIVE

## 2020-09-16 LAB — VARICELLA ZOSTER ANTIBODY, IGG: Varicella zoster IgG: 1936 index (ref >165)

## 2020-09-16 LAB — TSH: TSH: 1.15 u[IU]/mL (ref 0.450–4.500)

## 2020-09-16 LAB — ANTIBODY SCREEN: Antibody Screen: NEGATIVE

## 2020-09-16 LAB — RUBELLA SCREEN: Rubella Antibodies, IGG: 12.6 index (ref >0.99)

## 2020-09-17 LAB — MONITOR DRUG PROFILE 14(MW)
Amphetamine Scrn, Ur: NEGATIVE ng/mL
BARBITURATE SCREEN URINE: NEGATIVE ng/mL
BENZODIAZEPINE SCREEN, URINE: NEGATIVE ng/mL
Buprenorphine, Urine: NEGATIVE ng/mL
CANNABINOIDS UR QL SCN: NEGATIVE ng/mL
Cocaine (Metab) Scrn, Ur: NEGATIVE ng/mL
Creatinine(Crt), U: 66.7 mg/dL (ref 20.0–300.0)
Fentanyl, Urine: NEGATIVE pg/mL
Meperidine Screen, Urine: NEGATIVE ng/mL
Methadone Screen, Urine: NEGATIVE ng/mL
OXYCODONE+OXYMORPHONE UR QL SCN: NEGATIVE ng/mL
Opiate Scrn, Ur: NEGATIVE ng/mL
Ph of Urine: 6.2 (ref 4.5–8.9)
Phencyclidine Qn, Ur: NEGATIVE ng/mL
Propoxyphene Scrn, Ur: NEGATIVE ng/mL
SPECIFIC GRAVITY: 1.013
Tramadol Screen, Urine: NEGATIVE ng/mL

## 2020-09-17 LAB — GC/CHLAMYDIA PROBE AMP
Chlamydia trachomatis, NAA: NEGATIVE
Neisseria Gonorrhoeae by PCR: NEGATIVE

## 2020-09-17 LAB — CULTURE, OB URINE

## 2020-09-17 LAB — URINE CULTURE, OB REFLEX

## 2020-10-01 ENCOUNTER — Other Ambulatory Visit: Payer: Self-pay

## 2020-10-01 ENCOUNTER — Ambulatory Visit (INDEPENDENT_AMBULATORY_CARE_PROVIDER_SITE_OTHER): Payer: Medicaid Other | Admitting: Certified Nurse Midwife

## 2020-10-01 ENCOUNTER — Encounter: Payer: Self-pay | Admitting: Certified Nurse Midwife

## 2020-10-01 VITALS — BP 61/45 | HR 68 | Wt 252.0 lb

## 2020-10-01 DIAGNOSIS — O09529 Supervision of elderly multigravida, unspecified trimester: Secondary | ICD-10-CM | POA: Diagnosis not present

## 2020-10-01 DIAGNOSIS — Z3A12 12 weeks gestation of pregnancy: Secondary | ICD-10-CM | POA: Diagnosis not present

## 2020-10-01 LAB — POCT URINALYSIS DIPSTICK OB
Bilirubin, UA: NEGATIVE
Blood, UA: NEGATIVE
Glucose, UA: NEGATIVE
Ketones, UA: NEGATIVE
Leukocytes, UA: NEGATIVE
Nitrite, UA: NEGATIVE
POC,PROTEIN,UA: NEGATIVE
Spec Grav, UA: 1.02 (ref 1.010–1.025)
Urobilinogen, UA: 0.2 E.U./dL
pH, UA: 5 (ref 5.0–8.0)

## 2020-10-01 NOTE — Progress Notes (Signed)
NEW OB HISTORY AND PHYSICAL  SUBJECTIVE:       Morgan Mann is a 34 y.o. [phone] female, Patient's last menstrual period was 07/08/2020 (exact date)., Estimated Date of Delivery: 04/14/21, [redacted]w[redacted]d, presents today for establishment of Prenatal Care. She has no unusual complaints : occasional vomiting.   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  Gynecologic History Patient's last menstrual period was 07/08/2020 (exact date). Normal Contraception: none Last Pap: 02/28/19. Results were: normal  Obstetric History OB History  Gravida Para Term Preterm AB Living  6 2 2   3 2   SAB TAB Ectopic Multiple Live Births    3   0 2    # Outcome Date GA Lbr Len/2nd Weight Sex Delivery Anes PTL Lv  6 Current           5 Term 07/15/19 [redacted]w[redacted]d 11:00 / 00:14 5 lb 10.7 oz (2.57 kg) F Vag-Spont None  LIV     Birth Comments: dark mongolian spot left shoulder, buttocks  4 TAB 2015          3 TAB 2010          2 TAB 2009          1 Term 11/18/05 [redacted]w[redacted]d  6 lb 7 oz (2.92 kg) M Vag-Spont EPI N LIV    Past Medical History:  Diagnosis Date  . Anemia     Past Surgical History:  Procedure Laterality Date  . EYE SURGERY     cosmetic surgery    Current Outpatient Medications on File Prior to Visit  Medication Sig Dispense Refill  . acetaminophen (TYLENOL) 500 MG tablet Take 500 mg by mouth every 6 (six) hours as needed.    . Doxylamine-Pyridoxine (DICLEGIS) 10-10 MG TBEC Take 2 tablets by mouth at bedtime. If symptoms persist, add one tablet in the morning and one in the afternoon 100 tablet 5  . ferrous sulfate 325 (65 FE) MG tablet Take 325 mg by mouth every other day.    . Prenatal Vit-Fe Fumarate-FA (PRENATAL MULTIVITAMIN) TABS tablet Take 1 tablet by mouth daily at 12 noon.     No current facility-administered medications on file prior to visit.    No Known  Allergies  Social History   Socioeconomic History  . Marital status: Single    Spouse name: Not on file  . Number of children: Not on file  . Years of education: Not on file  . Highest education level: Not on file  Occupational History  . Not on file  Tobacco Use  . Smoking status: Never Smoker  . Smokeless tobacco: Never Used  Vaping Use  . Vaping Use: Never used  Substance and Sexual Activity  . Alcohol use: No  . Drug use: Never  . Sexual activity: Yes    Birth control/protection: None  Other Topics Concern  . Not on file  Social History Narrative  . Not on file   Social Determinants of Health   Financial Resource Strain:   . Difficulty of Paying Living Expenses: Not on file  Food Insecurity:   . Worried About [redacted]w[redacted]d in the Last Year: Not on file  . Ran Out of Food in the Last Year: Not on file  Transportation Needs:   . Lack of Transportation (Medical): Not on file  . Lack of Transportation (Non-Medical): Not on file  Physical Activity:   .  Days of Exercise per Week: Not on file  . Minutes of Exercise per Session: Not on file  Stress:   . Feeling of Stress : Not on file  Social Connections:   . Frequency of Communication with Friends and Family: Not on file  . Frequency of Social Gatherings with Friends and Family: Not on file  . Attends Religious Services: Not on file  . Active Member of Clubs or Organizations: Not on file  . Attends Banker Meetings: Not on file  . Marital Status: Not on file  Intimate Partner Violence:   . Fear of Current or Ex-Partner: Not on file  . Emotionally Abused: Not on file  . Physically Abused: Not on file  . Sexually Abused: Not on file    Family History  Problem Relation Age of Onset  . Breast cancer Neg Hx   . Ovarian cancer Neg Hx   . Colon cancer Neg Hx     The following portions of the patient's history were reviewed and updated as appropriate: allergies, current medications, past OB  history, past medical history, past surgical history, past family history, past social history, and problem list.    OBJECTIVE: Initial Physical Exam (New OB)  GENERAL APPEARANCE: alert, well appearing, in no apparent distress, oriented to person, place and time, overweight HEAD: normocephalic, atraumatic MOUTH: mucous membranes moist, pharynx normal without lesions THYROID: no thyromegaly or masses present BREASTS: no masses noted, no significant tenderness, no palpable axillary nodes, no skin changes LUNGS: clear to auscultation, no wheezes, rales or rhonchi, symmetric air entry HEART: regular rate and rhythm, no murmurs ABDOMEN: soft, nontender, nondistended, no abnormal masses, no epigastric pain, obese and FHT present EXTREMITIES: no redness or tenderness in the calves or thighs SKIN: normal coloration and turgor, no rashes LYMPH NODES: no adenopathy palpable NEUROLOGIC: alert, oriented, normal speech, no focal findings or movement disorder noted  PELVIC EXAM EXTERNAL GENITALIA: normal appearing vulva with no masses, tenderness or lesions VAGINA: no abnormal discharge or lesions CERVIX: no lesions or cervical motion tenderness UTERUS: gravid ADNEXA: no masses palpable and nontender OB EXAM PELVIMETRY: appears adequate RECTUM: exam not indicated  ASSESSMENT: Normal pregnancy  PLAN: Prenatal care See ordersNew OB counseling: The patient has been given an overview regarding routine prenatal care. Recommendations regarding diet, weight gain, and exercise in pregnancy were given. Prenatal testing, optional genetic testing, carrier screening test, and ultrasound use in pregnancy were reviewed. Discussed waterbirth , has decided that with all that was needed that she is no longer interested. Benefits of Breast Feeding were discussed. The patient is encouraged to consider nursing her baby post partum.  Doreene Burke, CNM

## 2020-10-01 NOTE — Patient Instructions (Signed)

## 2020-10-02 ENCOUNTER — Other Ambulatory Visit: Payer: Self-pay | Admitting: Certified Nurse Midwife

## 2020-10-02 LAB — CBC
Hematocrit: 33.8 % — ABNORMAL LOW (ref 34.0–46.6)
Hemoglobin: 10.8 g/dL — ABNORMAL LOW (ref 11.1–15.9)
MCH: 30.9 pg (ref 26.6–33.0)
MCHC: 32 g/dL (ref 31.5–35.7)
MCV: 97 fL (ref 79–97)
Platelets: 337 10*3/uL (ref 150–450)
RBC: 3.5 x10E6/uL — ABNORMAL LOW (ref 3.77–5.28)
RDW: 13.1 % (ref 11.7–15.4)
WBC: 5.6 10*3/uL (ref 3.4–10.8)

## 2020-10-02 LAB — HEPATITIS C ANTIBODY: Hep C Virus Ab: <0.1 s/co ratio (ref 0.0–0.9)

## 2020-10-02 MED ORDER — FUSION PLUS PO CAPS: 1.0000 | ORAL_CAPSULE | Freq: Every day | ORAL | 6 refills | Status: DC

## 2020-10-27 ENCOUNTER — Other Ambulatory Visit: Payer: Self-pay

## 2020-10-27 ENCOUNTER — Ambulatory Visit (INDEPENDENT_AMBULATORY_CARE_PROVIDER_SITE_OTHER): Payer: Medicaid Other | Admitting: Certified Nurse Midwife

## 2020-10-27 ENCOUNTER — Encounter: Payer: Self-pay | Admitting: Certified Nurse Midwife

## 2020-10-27 VITALS — BP 99/72 | HR 75 | Wt 248.0 lb

## 2020-10-27 DIAGNOSIS — Z3689 Encounter for other specified antenatal screening: Secondary | ICD-10-CM

## 2020-10-27 DIAGNOSIS — Z3A15 15 weeks gestation of pregnancy: Secondary | ICD-10-CM

## 2020-10-27 DIAGNOSIS — Z3482 Encounter for supervision of other normal pregnancy, second trimester: Secondary | ICD-10-CM

## 2020-10-27 DIAGNOSIS — Z23 Encounter for immunization: Secondary | ICD-10-CM | POA: Diagnosis not present

## 2020-10-27 LAB — POCT URINALYSIS DIPSTICK OB
Bilirubin, UA: NEGATIVE
Blood, UA: NEGATIVE
Glucose, UA: NEGATIVE
Ketones, UA: NEGATIVE
Leukocytes, UA: NEGATIVE
Nitrite, UA: NEGATIVE
Spec Grav, UA: 1.02 (ref 1.010–1.025)
Urobilinogen, UA: 0.2 E.U./dL
pH, UA: 6.5 (ref 5.0–8.0)

## 2020-10-27 NOTE — Progress Notes (Signed)
ROB- Doing well, declines water birth at this time. Flu vaccine given, see chart. Anticipatory guidance regarding course of prenatal care. Reviewed red flag symptoms and when to call. RTC x 4 weeks for ANATOMY SCAN and ROB or sooner if needed.

## 2020-10-27 NOTE — Patient Instructions (Signed)
WHAT OB PATIENTS CAN EXPECT   Confirmation of pregnancy and ultrasound ordered if medically indicated-[redacted] weeks gestation  New OB (NOB) intake with nurse and New OB (NOB) labs- [redacted] weeks gestation  New OB (NOB) physical examination with provider- 11/[redacted] weeks gestation  Flu vaccine-[redacted] weeks gestation  Anatomy scan-[redacted] weeks gestation  Glucose tolerance test, blood work to test for anemia, T-dap vaccine-[redacted] weeks gestation  Vaginal swabs/cultures-STD/Group B strep-[redacted] weeks gestation  Appointments every 4 weeks until 28 weeks  Every 2 weeks from 28 weeks until 36 weeks  Weekly visits from 36 weeks until delivery     Common Medications Safe in Pregnancy  Acne:      Constipation:  Benzoyl Peroxide     Colace  Clindamycin      Dulcolax Suppository  Topica Erythromycin     Fibercon  Salicylic Acid      Metamucil         Miralax AVOID:        Senakot   Accutane    Cough:  Retin-A       Cough Drops  Tetracycline      Phenergan w/ Codeine if Rx  Minocycline      Robitussin (Plain & DM)  Antibiotics:     Crabs/Lice:  Ceclor       RID  Cephalosporins    AVOID:  E-Mycins      Kwell  Keflex  Macrobid/Macrodantin   Diarrhea:  Penicillin      Kao-Pectate  Zithromax      Imodium AD         PUSH FLUIDS AVOID:       Cipro     Fever:  Tetracycline      Tylenol (Regular or Extra  Minocycline       Strength)  Levaquin      Extra Strength-Do not          Exceed 8 tabs/24 hrs Caffeine:        <218m/day (equiv. To 1 cup of coffee or  approx. 3 12 oz sodas)         Gas: Cold/Hayfever:       Gas-X  Benadryl      Mylicon  Claritin       Phazyme  **Claritin-D        Chlor-Trimeton    Headaches:  Dimetapp      ASA-Free Excedrin  Drixoral-Non-Drowsy     Cold Compress  Mucinex (Guaifenasin)     Tylenol (Regular or Extra  Sudafed/Sudafed-12 Hour     Strength)  **Sudafed PE Pseudoephedrine   Tylenol Cold & Sinus     Vicks Vapor Rub  Zyrtec  **AVOID if Problems With Blood  Pressure         Heartburn: Avoid lying down for at least 1 hour after meals  Aciphex      Maalox     Rash:  Milk of Magnesia     Benadryl    Mylanta       1% Hydrocortisone Cream  Pepcid  Pepcid Complete   Sleep Aids:  Prevacid      Ambien   Prilosec       Benadryl  Rolaids       Chamomile Tea  Tums (Limit 4/day)     Unisom         Tylenol PM         Warm milk-add vanilla or  Hemorrhoids:       Sugar for taste  Anusol/Anusol H.C.  (  RX: Analapram 2.5%)  Sugar Substitutes:  Hydrocortisone OTC     Ok in moderation  Preparation H      Tucks        Vaseline lotion applied to tissue with wiping    Herpes:     Throat:  Acyclovir      Oragel  Famvir  Valtrex     Vaccines:         Flu Shot Leg Cramps:       *Gardasil  Benadryl      Hepatitis A         Hepatitis B Nasal Spray:       Pneumovax  Saline Nasal Spray     Polio Booster         Tetanus Nausea:       Tuberculosis test or PPD  Vitamin B6 25 mg TID   AVOID:    Dramamine      *Gardasil  Emetrol       Live Poliovirus  Ginger Root 250 mg QID    MMR (measles, mumps &  High Complex Carbs @ Bedtime    rebella)  Sea Bands-Accupressure    Varicella (Chickenpox)  Unisom 1/2 tab TID     *No known complications           If received before Pain:         Known pregnancy;   Darvocet       Resume series after  Lortab        Delivery  Percocet    Yeast:   Tramadol      Femstat  Tylenol 3      Gyne-lotrimin  Ultram       Monistat  Vicodin           MISC:         All Sunscreens           Hair Coloring/highlights          Insect Repellant's          (Including DEET)         Mystic Tans   Round Ligament Pain  The round ligament is a cord of muscle and tissue that helps support the uterus. It can become a source of pain during pregnancy if it becomes stretched or twisted as the baby grows. The pain usually begins in the second trimester (13-28 weeks) of pregnancy, and it can come and go until the baby is delivered. It is  not a serious problem, and it does not cause harm to the baby. Round ligament pain is usually a short, sharp, and pinching pain, but it can also be a dull, lingering, and aching pain. The pain is felt in the lower side of the abdomen or in the groin. It usually starts deep in the groin and moves up to the outside of the hip area. The pain may occur when you:  Suddenly change position, such as quickly going from a sitting to standing position.  Roll over in bed.  Cough or sneeze.  Do physical activity. Follow these instructions at home:   Watch your condition for any changes.  When the pain starts, relax. Then try any of these methods to help with the pain: ? Sitting down. ? Flexing your knees up to your abdomen. ? Lying on your side with one pillow under your abdomen and another pillow between your legs. ? Sitting in a warm bath for 15-20 minutes or until the pain goes away.  Take over-the-counter and prescription medicines only as told by your health care provider.  Move slowly when you sit down or stand up.  Avoid long walks if they cause pain.  Stop or reduce your physical activities if they cause pain.  Keep all follow-up visits as told by your health care provider. This is important. Contact a health care provider if:  Your pain does not go away with treatment.  You feel pain in your back that you did not have before.  Your medicine is not helping. Get help right away if:  You have a fever or chills.  You develop uterine contractions.  You have vaginal bleeding.  You have nausea or vomiting.  You have diarrhea.  You have pain when you urinate. Summary  Round ligament pain is felt in the lower abdomen or groin. It is usually a short, sharp, and pinching pain. It can also be a dull, lingering, and aching pain.  This pain usually begins in the second trimester (13-28 weeks). It occurs because the uterus is stretching with the growing baby, and it is not harmful  to the baby.  You may notice the pain when you suddenly change position, when you cough or sneeze, or during physical activity.  Relaxing, flexing your knees to your abdomen, lying on one side, or taking a warm bath may help to get rid of the pain.  Get help from your health care provider if the pain does not go away or if you have vaginal bleeding, nausea, vomiting, diarrhea, or painful urination. This information is not intended to replace advice given to you by your health care provider. Make sure you discuss any questions you have with your health care provider. Document Revised: 05/10/2018 Document Reviewed: 05/10/2018 Elsevier Patient Education  [place].    Back Pain in Pregnancy Back pain during pregnancy is common. Back pain may be caused by several factors that are related to changes during your pregnancy. Follow these instructions at home: Managing pain, stiffness, and swelling      If directed, for sudden (acute) back pain, put ice on the painful area. ? Put ice in a plastic bag. ? Place a towel between your skin and the bag. ? Leave the ice on for 20 minutes, 2-3 times per day.  If directed, apply heat to the affected area before you exercise. Use the heat source that your health care provider recommends, such as a moist heat pack or a heating pad. ? Place a towel between your skin and the heat source. ? Leave the heat on for 20-30 minutes. ? Remove the heat if your skin turns bright red. This is especially important if you are unable to feel pain, heat, or cold. You may have a greater risk of getting burned.  If directed, massage the affected area. Activity  Exercise as told by your health care provider. Gentle exercise is the best way to prevent or manage back pain.  Listen to your body when lifting. If lifting hurts, ask for help or bend your knees. This uses your leg muscles instead of your back muscles.  Squat down when picking up something from the  floor. Do not bend over.  Only use bed rest for short periods as told by your health care provider. Bed rest should only be used for the most severe episodes of back pain. Standing, sitting, and lying down  Do not stand in one place for long periods of time.  Use good posture when sitting.  Make sure your head rests over your shoulders and is not hanging forward. Use a pillow on your lower back if necessary.  Try sleeping on your side, preferably the left side, with a pregnancy support pillow or 1-2 regular pillows between your legs. ? If you have back pain after a night's rest, your bed may be too soft. ? A firm mattress may provide more support for your back during pregnancy. General instructions  Do not wear high heels.  Eat a healthy diet. Try to gain weight within your health care provider's recommendations.  Use a maternity girdle, elastic sling, or back brace as told by your health care provider.  Take over-the-counter and prescription medicines only as told by your health care provider.  Work with a physical therapist or massage therapist to find ways to manage back pain. Acupuncture or massage therapy may be helpful.  Keep all follow-up visits as told by your health care provider. This is important. Contact a health care provider if:  Your back pain interferes with your daily activities.  You have increasing pain in other parts of your body. Get help right away if:  You develop numbness, tingling, weakness, or problems with the use of your arms or legs.  You develop severe back pain that is not controlled with medicine.  You have a change in bowel or bladder control.  You develop shortness of breath, dizziness, or you faint.  You develop nausea, vomiting, or sweating.  You have back pain that is a rhythmic, cramping pain similar to labor pains. Labor pain is usually 1-2 minutes apart, lasts for about 1 minute, and involves a bearing down feeling or pressure in your  pelvis.  You have back pain and your water breaks or you have vaginal bleeding.  You have back pain or numbness that travels down your leg.  Your back pain developed after you fell.  You develop pain on one side of your back.  You see blood in your urine.  You develop skin blisters in the area of your back pain. Summary  Back pain may be caused by several factors that are related to changes during your pregnancy.  Follow instructions as told by your health care provider for managing pain, stiffness, and swelling.  Exercise as told by your health care provider. Gentle exercise is the best way to prevent or manage back pain.  Take over-the-counter and prescription medicines only as told by your health care provider.  Keep all follow-up visits as told by your health care provider. This is important. This information is not intended to replace advice given to you by your health care provider. Make sure you discuss any questions you have with your health care provider. Document Revised: 03/13/2019 Document Reviewed: 05/10/2018 Elsevier Patient Education  [place] of Pregnancy  The second trimester is from week 14 through week 27 (month 4 through 6). This is often the time in pregnancy that you feel your best. Often times, morning sickness has lessened or quit. You may have more energy, and you may get hungry more often. Your unborn baby is growing rapidly. At the end of the sixth month, he or she is about 9 inches long and weighs about 1 pounds. You will likely feel the baby move between 18 and 20 weeks of pregnancy. Follow these instructions at home: Medicines  Take over-the-counter and prescription medicines only as told by your doctor. Some medicines are safe and some medicines are not safe  during pregnancy.  Take a prenatal vitamin that contains at least 600 micrograms (mcg) of folic acid.  If you have trouble pooping (constipation), take medicine  that will make your stool soft (stool softener) if your doctor approves. Eating and drinking   Eat regular, healthy meals.  Avoid raw meat and uncooked cheese.  If you get low calcium from the food you eat, talk to your doctor about taking a daily calcium supplement.  Avoid foods that are high in fat and sugars, such as fried and sweet foods.  If you feel sick to your stomach (nauseous) or throw up (vomit): ? Eat 4 or 5 small meals a day instead of 3 large meals. ? Try eating a few soda crackers. ? Drink liquids between meals instead of during meals.  To prevent constipation: ? Eat foods that are high in fiber, like fresh fruits and vegetables, whole grains, and beans. ? Drink enough fluids to keep your pee (urine) clear or pale yellow. Activity  Exercise only as told by your doctor. Stop exercising if you start to have cramps.  Do not exercise if it is too hot, too humid, or if you are in a place of great height (high altitude).  Avoid heavy lifting.  Wear low-heeled shoes. Sit and stand up straight.  You can continue to have sex unless your doctor tells you not to. Relieving pain and discomfort  Wear a good support bra if your breasts are tender.  Take warm water baths (sitz baths) to soothe pain or discomfort caused by hemorrhoids. Use hemorrhoid cream if your doctor approves.  Rest with your legs raised if you have leg cramps or low back pain.  If you develop puffy, bulging veins (varicose veins) in your legs: ? Wear support hose or compression stockings as told by your doctor. ? Raise (elevate) your feet for 15 minutes, 3-4 times a day. ? Limit salt in your food. Prenatal care  Write down your questions. Take them to your prenatal visits.  Keep all your prenatal visits as told by your doctor. This is important. Safety  Wear your seat belt when driving.  Make a list of emergency phone numbers, including numbers for family, friends, the hospital, and police and  fire departments. General instructions  Ask your doctor about the right foods to eat or for help finding a counselor, if you need these services.  Ask your doctor about local prenatal classes. Begin classes before month 6 of your pregnancy.  Do not use hot tubs, steam rooms, or saunas.  Do not douche or use tampons or scented sanitary pads.  Do not cross your legs for long periods of time.  Visit your dentist if you have not done so. Use a soft toothbrush to brush your teeth. Floss gently.  Avoid all smoking, herbs, and alcohol. Avoid drugs that are not approved by your doctor.  Do not use any products that contain nicotine or tobacco, such as cigarettes and e-cigarettes. If you need help quitting, ask your doctor.  Avoid cat litter boxes and soil used by cats. These carry germs that can cause birth defects in the baby and can cause a loss of your baby (miscarriage) or stillbirth. Contact a doctor if:  You have mild cramps or pressure in your lower belly.  You have pain when you pee (urinate).  You have bad smelling fluid coming from your vagina.  You continue to feel sick to your stomach (nauseous), throw up (vomit), or have watery poop (diarrhea).  You have a nagging pain in your belly area.  You feel dizzy. Get help right away if:  You have a fever.  You are leaking fluid from your vagina.  You have spotting or bleeding from your vagina.  You have severe belly cramping or pain.  You lose or gain weight rapidly.  You have trouble catching your breath and have chest pain.  You notice sudden or extreme puffiness (swelling) of your face, hands, ankles, feet, or legs.  You have not felt the baby move in over an hour.  You have severe headaches that do not go away when you take medicine.  You have trouble seeing. Summary  The second trimester is from week 14 through week 27 (months 4 through 6). This is often the time in pregnancy that you feel your best.  To  take care of yourself and your unborn baby, you will need to eat healthy meals, take medicines only if your doctor tells you to do so, and do activities that are safe for you and your baby.  Call your doctor if you get sick or if you notice anything unusual about your pregnancy. Also, call your doctor if you need help with the right food to eat, or if you want to know what activities are safe for you. This information is not intended to replace advice given to you by your health care provider. Make sure you discuss any questions you have with your health care provider. Document Revised: 03/16/2019 Document Reviewed: 12/28/2016 Elsevier Patient Education  [place].

## 2020-10-27 NOTE — Progress Notes (Signed)
I have seen, interviewed, and examined the patient in conjunction with the [employer] and affirm the diagnosis and management plan.   Gunnar Bulla, CNM [facility], [facility] 10/27/20 11:26 AM

## 2020-11-25 ENCOUNTER — Other Ambulatory Visit: Payer: Self-pay

## 2020-11-25 ENCOUNTER — Ambulatory Visit (INDEPENDENT_AMBULATORY_CARE_PROVIDER_SITE_OTHER): Payer: Medicaid Other

## 2020-11-25 ENCOUNTER — Ambulatory Visit (INDEPENDENT_AMBULATORY_CARE_PROVIDER_SITE_OTHER): Payer: Medicaid Other | Admitting: Certified Nurse Midwife

## 2020-11-25 ENCOUNTER — Encounter: Payer: Self-pay | Admitting: Certified Nurse Midwife

## 2020-11-25 VITALS — BP 101/64 | HR 62 | Wt 249.6 lb

## 2020-11-25 DIAGNOSIS — Z3482 Encounter for supervision of other normal pregnancy, second trimester: Secondary | ICD-10-CM

## 2020-11-25 DIAGNOSIS — Z3689 Encounter for other specified antenatal screening: Secondary | ICD-10-CM

## 2020-11-25 DIAGNOSIS — Z3A20 20 weeks gestation of pregnancy: Secondary | ICD-10-CM

## 2020-11-25 LAB — POCT URINALYSIS DIPSTICK OB
Bilirubin, UA: NEGATIVE
Blood, UA: NEGATIVE
Glucose, UA: NEGATIVE
Ketones, UA: NEGATIVE
Leukocytes, UA: NEGATIVE
Nitrite, UA: NEGATIVE
POC,PROTEIN,UA: NEGATIVE
Spec Grav, UA: 1.02 (ref 1.010–1.025)
Urobilinogen, UA: 0.2 E.U./dL
pH, UA: 5 (ref 5.0–8.0)

## 2020-11-25 NOTE — Progress Notes (Signed)
ROB doing well. Feels movement. Anatomy u/s today. See below. Results reviewed. Discussed EIF , information sheet given. Pt tearful , reassurance given. Discussed common finding. Follow up u/s 2 wks to complete anatomy. ROB with Michelle 4 wks.   Doreene Burke, CNM   Patient Name: Morgan Mann DOB: Mar 30, 1986 MRN: [id number] ULTRASOUND REPORT  Location: [facility] Date of Service: 11/25/2020   Indications:Anatomy Ultrasound   Findings:  Mason Jim intrauterine pregnancy is visualized with FHR at 152 BPM. Biometrics give an (U/S) Gestational age of [redacted]w[redacted]d and an (U/S) EDD of 04/22/2021; this correlates with the clinically established Estimated Date of Delivery: 04/14/21  Fetal presentation is transverse.  EFW: 242 g ( 9 oz). Fetal Percentile  Placenta: posterior. Grade: 1 AFI: subjectively normal.  Anatomic survey is incomplete for brain anatomy, Nose/Lip and there is an echogenic foci within the fetal heart measuring 2 mm.; Gender - female.    Right Ovary is normal in appearance. Left Ovary is normal appearance. Survey of the adnexa demonstrates no adnexal masses. There is no free peritoneal fluid in the cul de sac.  Impression: 1. [redacted]w[redacted]d Viable Singleton Intrauterine pregnancy by U/S. 2. (U/S) EDD is consistent with Clinically established Estimated Date of Delivery: 04/14/21 . 3. Incomplete anatomy as mentioned above. 4. Echogenic foci within fetal heart as described above.  Recommendations: 1.Clinical correlation with the patient's History and Physical Exam.   Jenine M. Marciano Sequin    RDM

## 2020-11-25 NOTE — Patient Instructions (Signed)

## 2020-11-25 NOTE — Addendum Note (Signed)
Addended by: Brooke Dare on: 11/25/2020 04:04 PM   Modules accepted: Orders

## 2020-12-06 NOTE — L&D Delivery Note (Signed)
Delivery Note  1523 Called in room by RN for impending birth. Fetal vertex visible with coached maternal pushing efforts.   Spontaneous vaginal birth of liveborn female infant in right occiput anterior position, single loose nuchal reduced on intact perineum at 1535. Infant immediately to maternal abdomen, delayed cord clamping, skin to skin. APGARs: 8, 9. Weight pending. Receiving nurse present at bedside for birth.   Pitocin bolus infusing, see chart. Spontaneous delivery of intact placenta at 1541. Uterus firm. Rubra small. Vault check completed under epidural anesthesia. EBL: 100 ml. Counts correct x 2.   Initiate routine postpartum care and orders. Mom to postpartum.  Baby to Couplet care / Skin to Skin.  FOB present at bedside and overjoyed with the birth of "Jade".    Serafina Royals, CNM [facility], [facility] 04/16/2021, 3:43 PM

## 2020-12-09 ENCOUNTER — Other Ambulatory Visit: Payer: Self-pay

## 2020-12-09 ENCOUNTER — Ambulatory Visit (INDEPENDENT_AMBULATORY_CARE_PROVIDER_SITE_OTHER): Payer: Medicaid Other

## 2020-12-09 DIAGNOSIS — Z3A20 20 weeks gestation of pregnancy: Secondary | ICD-10-CM

## 2020-12-09 DIAGNOSIS — Z362 Encounter for other antenatal screening follow-up: Secondary | ICD-10-CM

## 2020-12-25 ENCOUNTER — Other Ambulatory Visit: Payer: Self-pay

## 2020-12-25 ENCOUNTER — Ambulatory Visit (INDEPENDENT_AMBULATORY_CARE_PROVIDER_SITE_OTHER): Payer: Medicaid Other | Admitting: Certified Nurse Midwife

## 2020-12-25 VITALS — BP 105/70 | HR 101 | Wt 251.0 lb

## 2020-12-25 DIAGNOSIS — Z3482 Encounter for supervision of other normal pregnancy, second trimester: Secondary | ICD-10-CM

## 2020-12-25 DIAGNOSIS — Z3A24 24 weeks gestation of pregnancy: Secondary | ICD-10-CM

## 2020-12-25 LAB — POCT URINALYSIS DIPSTICK OB
Bilirubin, UA: NEGATIVE
Blood, UA: NEGATIVE
Glucose, UA: NEGATIVE
Ketones, UA: NEGATIVE
Leukocytes, UA: NEGATIVE
Nitrite, UA: NEGATIVE
Spec Grav, UA: 1.01 (ref 1.010–1.025)
Urobilinogen, UA: 0.2 E.U./dL
pH, UA: 7.5 (ref 5.0–8.0)

## 2020-12-25 NOTE — Progress Notes (Signed)
I have seen, interviewed, and examined the patient in conjunction with the [employer] and affirm the diagnosis and management plan.   Gunnar Bulla, CNM [facility], [facility] 12/25/20 12:21 PM

## 2020-12-25 NOTE — Progress Notes (Signed)
ROB- Doing well overall. Reports return of nausea with occasional vomiting better than first trimester symptoms, managing with home treatment measures.  Follow up ultrasound findings discussed with detail with patient, verbalized understanding. Anticipatory guidance regarding course of prenatal care. Reviewed red flag symptoms and when to call. RTC x 4 weeks for 28 week labs and ROB with ANNIE.  Juliann Pares, Student-MidWife [employer] 12/25/20 10:38 AM

## 2020-12-25 NOTE — Patient Instructions (Signed)
Round Ligament Pain  The round ligament is a cord of muscle and tissue that helps support the uterus. It can become a source of pain during pregnancy if it becomes stretched or twisted as the baby grows. The pain usually begins in the second trimester (13-28 weeks) of pregnancy, and it can come and go until the baby is delivered. It is not a serious problem, and it does not cause harm to the baby. Round ligament pain is usually a short, sharp, and pinching pain, but it can also be a dull, lingering, and aching pain. The pain is felt in the lower side of the abdomen or in the groin. It usually starts deep in the groin and moves up to the outside of the hip area. The pain may occur when you:  Suddenly change position, such as quickly going from a sitting to standing position.  Roll over in bed.  Cough or sneeze.  Do physical activity. Follow these instructions at home:  Watch your condition for any changes.  When the pain starts, relax. Then try any of these methods to help with the pain: ? Sitting down. ? Flexing your knees up to your abdomen. ? Lying on your side with one pillow under your abdomen and another pillow between your legs. ? Sitting in a warm bath for 15-20 minutes or until the pain goes away.  Take over-the-counter and prescription medicines only as told by your health care provider.  Move slowly when you sit down or stand up.  Avoid long walks if they cause pain.  Stop or reduce your physical activities if they cause pain.  Keep all follow-up visits as told by your health care provider. This is important.   Contact a health care provider if:  Your pain does not go away with treatment.  You feel pain in your back that you did not have before.  Your medicine is not helping. Get help right away if:  You have a fever or chills.  You develop uterine contractions.  You have vaginal bleeding.  You have nausea or vomiting.  You have diarrhea.  You have pain  when you urinate. Summary  Round ligament pain is felt in the lower abdomen or groin. It is usually a short, sharp, and pinching pain. It can also be a dull, lingering, and aching pain.  This pain usually begins in the second trimester (13-28 weeks). It occurs because the uterus is stretching with the growing baby, and it is not harmful to the baby.  You may notice the pain when you suddenly change position, when you cough or sneeze, or during physical activity.  Relaxing, flexing your knees to your abdomen, lying on one side, or taking a warm bath may help to get rid of the pain.  Get help from your health care provider if the pain does not go away or if you have vaginal bleeding, nausea, vomiting, diarrhea, or painful urination. This information is not intended to replace advice given to you by your health care provider. Make sure you discuss any questions you have with your health care provider. Document Revised: 05/10/2018 Document Reviewed: 05/10/2018 Elsevier Patient Education  2021 [employer] Inc[employer]     Second Trimester of Pregnancy  The second trimester of pregnancy is from week 13 through week 27. This is also called months 4 through 6 of pregnancy. This is often the time when you feel your best. During the second trimester:  Morning sickness is less or has stopped.  You   may have more energy.  You may feel hungry more often. At this time, your unborn baby (fetus) is growing very fast. At the end of the sixth month, the unborn baby may be up to 12 inches long and weigh about 1 pounds. You will likely start to feel the baby move between 16 and 20 weeks of pregnancy. Body changes during your second trimester Your body continues to go through many changes during this time. The changes vary and generally return to normal after the baby is born. Physical changes  You will gain more weight.  You may start to get stretch marks on your hips, belly (abdomen), and breasts.  Your  breasts will grow and may hurt.  Dark spots or blotches may develop on your face.  A dark line from your belly button to the pubic area (linea nigra) may appear.  You may have changes in your hair. Health changes  You may have headaches.  You may have heartburn.  You may have trouble pooping (constipation).  You may have hemorrhoids or swollen, bulging veins (varicose veins).  Your gums may bleed.  You may pee (urinate) more often.  You may have back pain. Follow these instructions at home: Medicines  Take over-the-counter and prescription medicines only as told by your doctor. Some medicines are not safe during pregnancy.  Take a prenatal vitamin that contains at least 600 micrograms (mcg) of folic acid. Eating and drinking  Eat healthy meals that include: ? Fresh fruits and vegetables. ? Whole grains. ? Good sources of protein, such as meat, eggs, or tofu. ? Low-fat dairy products.  Avoid raw meat and unpasteurized juice, milk, and cheese.  You may need to take these actions to prevent or treat trouble pooping: ? Drink enough fluids to keep your pee (urine) pale yellow. ? Eat foods that are high in fiber. These include beans, whole grains, and fresh fruits and vegetables. ? Limit foods that are high in fat and sugar. These include fried or sweet foods. Activity  Exercise only as told by your doctor. Most people can do their usual exercise during pregnancy. Try to exercise for 30 minutes at least 5 days a week.  Stop exercising if you have pain or cramps in your belly or lower back.  Do not exercise if it is too hot or too humid, or if you are in a place of great height (high altitude).  Avoid heavy lifting.  If you choose to, you may have sex unless your doctor tells you not to. Relieving pain and discomfort  Wear a good support bra if your breasts are sore.  Take warm water baths (sitz baths) to soothe pain or discomfort caused by hemorrhoids. Use  hemorrhoid cream if your doctor approves.  Rest with your legs raised (elevated) if you have leg cramps or low back pain.  If you develop bulging veins in your legs: ? Wear support hose as told by your doctor. ? Raise your feet for 15 minutes, 3-4 times a day. ? Limit salt in your food. Safety  Wear your seat belt at all times when you are in a car.  Talk with your doctor if someone is hurting you or yelling at you a lot. Lifestyle  Do not use hot tubs, steam rooms, or saunas.  Do not douche. Do not use tampons or scented sanitary pads.  Avoid cat litter boxes and soil used by cats. These carry germs that can harm your baby and can cause a loss   of your baby by miscarriage or stillbirth.  Do not use herbal medicines, illegal drugs, or medicines that are not approved by your doctor. Do not drink alcohol.  Do not smoke or use any products that contain nicotine or tobacco. If you need help quitting, ask your doctor. General instructions  Keep all follow-up visits. This is important.  Ask your doctor about local prenatal classes.  Ask your doctor about the right foods to eat or for help finding a counselor. Where to find more information  American Pregnancy Association: americanpregnancy.org  American College of Obstetricians and Gynecologists: www.acog.org  Office on Women's Health: womenshealth.gov/pregnancy Contact a doctor if:  You have a headache that does not go away when you take medicine.  You have changes in how you see, or you see spots in front of your eyes.  You have mild cramps, pressure, or pain in your lower belly.  You continue to feel like you may vomit (nauseous), you vomit, or you have watery poop (diarrhea).  You have bad-smelling fluid coming from your vagina.  You have pain when you pee or your pee smells bad.  You have very bad swelling of your face, hands, ankles, feet, or legs.  You have a fever. Get help right away if:  You are leaking  fluid from your vagina.  You have spotting or bleeding from your vagina.  You have very bad belly cramping or pain.  You have trouble breathing.  You have chest pain.  You faint.  You have not felt your baby move for the time period told by your doctor.  You have new or increased pain, swelling, or redness in an arm or leg. Summary  The second trimester of pregnancy is from week 13 through week 27 (months 4 through 6).  Eat healthy meals.  Exercise as told by your doctor. Most people can do their usual exercise during pregnancy.  Do not use herbal medicines, illegal drugs, or medicines that are not approved by your doctor. Do not drink alcohol.  Call your doctor if you get sick or if you notice anything unusual about your pregnancy. This information is not intended to replace advice given to you by your health care provider. Make sure you discuss any questions you have with your health care provider. Document Revised: 04/30/2020 Document Reviewed: 03/06/2020 Elsevier Patient Education  2021 Else[employer] Inc.  

## 2021-01-21 ENCOUNTER — Other Ambulatory Visit: Payer: Self-pay

## 2021-01-21 ENCOUNTER — Encounter: Payer: Self-pay | Admitting: Certified Nurse Midwife

## 2021-01-21 ENCOUNTER — Ambulatory Visit (INDEPENDENT_AMBULATORY_CARE_PROVIDER_SITE_OTHER): Payer: Medicaid Other | Admitting: Certified Nurse Midwife

## 2021-01-21 ENCOUNTER — Other Ambulatory Visit: Payer: Medicaid Other

## 2021-01-21 VITALS — BP 97/61 | HR 76 | Wt 251.4 lb

## 2021-01-21 DIAGNOSIS — Z23 Encounter for immunization: Secondary | ICD-10-CM

## 2021-01-21 DIAGNOSIS — Z3A28 28 weeks gestation of pregnancy: Secondary | ICD-10-CM

## 2021-01-21 DIAGNOSIS — Z0289 Encounter for other administrative examinations: Secondary | ICD-10-CM

## 2021-01-21 LAB — POCT URINALYSIS DIPSTICK OB
Bilirubin, UA: NEGATIVE
Blood, UA: NEGATIVE
Glucose, UA: NEGATIVE
Ketones, UA: NEGATIVE
Leukocytes, UA: NEGATIVE
Nitrite, UA: NEGATIVE
POC,PROTEIN,UA: NEGATIVE
Spec Grav, UA: 1.01 (ref 1.010–1.025)
Urobilinogen, UA: 0.2 E.U./dL
pH, UA: 6.5 (ref 5.0–8.0)

## 2021-01-21 NOTE — Progress Notes (Signed)
ROB doing well. 28 wk labs today. BTC completed, RSB done -see check list. Sample birth plan given. Information of birth control given as well. She is planning BTL, consents completed and sign , scan to chart, copy given to pt. Will follow up next visits. ROB 2 wk with [place] Blas, CNM

## 2021-01-21 NOTE — Patient Instructions (Signed)
Oral Glucose Tolerance Test During Pregnancy Why am I having this test? The oral glucose tolerance test (OGTT) is done to check how your body processes blood sugar (glucose). This is one of several tests used to diagnose diabetes that develops during pregnancy (gestational diabetes mellitus). Gestational diabetes is a short-term form of diabetes that some women develop while they are pregnant. It usually occurs during the second trimester of pregnancy and goes away after delivery. Testing, or screening, for gestational diabetes usually occurs at weeks 24-28 of pregnancy. You may have the OGTT test after having a 1-hour glucose screening test if the results from that test indicate that you may have gestational diabetes. This test may also be needed if:  You have a history of gestational diabetes.  There is a history of giving birth to very large babies or of losing pregnancies (having stillbirths).  You have signs and symptoms of diabetes, such as: ? Changes in your eyesight. ? Tingling or numbness in your hands or feet. ? Changes in hunger, thirst, and urination, and these are not explained by your pregnancy. What is being tested? This test measures the amount of glucose in your blood at different times during a period of 3 hours. This shows how well your body can process glucose. What kind of sample is taken? Blood samples are required for this test. They are usually collected by inserting a needle into a blood vessel.   How do I prepare for this test?  For 3 days before your test, eat normally. Have plenty of carbohydrate-rich foods.  Follow instructions from your health care provider about: ? Eating or drinking restrictions on the day of the test. You may be asked not to eat or drink anything other than water (to fast) starting 8-10 hours before the test. ? Changing or stopping your regular medicines. Some medicines may interfere with this test. Tell a health care provider about:  All  medicines you are taking, including vitamins, herbs, eye drops, creams, and over-the-counter medicines.  Any blood disorders you have.  Any surgeries you have had.  Any medical conditions you have. What happens during the test? First, your blood glucose will be measured. This is referred to as your fasting blood glucose because you fasted before the test. Then, you will drink a glucose solution that contains a certain amount of glucose. Your blood glucose will be measured again 1, 2, and 3 hours after you drink the solution. This test takes about 3 hours to complete. You will need to stay at the testing location during this time. During the testing period:  Do not eat or drink anything other than the glucose solution.  Do not exercise.  Do not use any products that contain nicotine or tobacco, such as cigarettes, e-cigarettes, and chewing tobacco. These can affect your test results. If you need help quitting, ask your health care provider. The testing procedure may vary among health care providers and hospitals. How are the results reported? Your results will be reported as milligrams of glucose per deciliter of blood (mg/dL) or millimoles per liter (mmol/L). There is more than one source for screening and diagnosis reference values used to diagnose gestational diabetes. Your health care provider will compare your results to normal values that were established after testing a large group of people (reference values). Reference values may vary among labs and hospitals. For this test (Carpenter-Coustan), reference values are:  Fasting: 95 mg/dL (5.3 mmol/L).  1 hour: 180 mg/dL (10.0 mmol/L).  2 hour:   155 mg/dL (8.6 mmol/L).  3 hour: 140 mg/dL (7.8 mmol/L). What do the results mean? Results below the reference values are considered normal. If two or more of your blood glucose levels are at or above the reference values, you may be diagnosed with gestational diabetes. If only one level is  high, your health care provider may suggest repeat testing or other tests to confirm a diagnosis. Talk with your health care provider about what your results mean. Questions to ask your health care provider Ask your health care provider, or the department that is doing the test:  When will my results be ready?  How will I get my results?  What are my treatment options?  What other tests do I need?  What are my next steps? Summary  The oral glucose tolerance test (OGTT) is one of several tests used to diagnose diabetes that develops during pregnancy (gestational diabetes mellitus). Gestational diabetes is a short-term form of diabetes that some women develop while they are pregnant.  You may have the OGTT test after having a 1-hour glucose screening test if the results from that test show that you may have gestational diabetes. You may also have this test if you have any symptoms or risk factors for this type of diabetes.  Talk with your health care provider about what your results mean. This information is not intended to replace advice given to you by your health care provider. Make sure you discuss any questions you have with your health care provider. Document Revised: 05/01/2020 Document Reviewed: 05/01/2020 Elsevier Patient Education  2021 [employer] Inc.  

## 2021-01-22 LAB — CBC
Hematocrit: 30.9 % — ABNORMAL LOW (ref 34.0–46.6)
Hemoglobin: 10.5 g/dL — ABNORMAL LOW (ref 11.1–15.9)
MCH: 31.7 pg (ref 26.6–33.0)
MCHC: 34 g/dL (ref 31.5–35.7)
MCV: 93 fL (ref 79–97)
Platelets: 310 10*3/uL (ref 150–450)
RBC: 3.31 x10E6/uL — ABNORMAL LOW (ref 3.77–5.28)
RDW: 12.9 % (ref 11.7–15.4)
WBC: 4.9 10*3/uL (ref 3.4–10.8)

## 2021-01-22 LAB — GLUCOSE, 1 HOUR GESTATIONAL: Gestational Diabetes Screen: 69 mg/dL (ref 65–139)

## 2021-01-22 LAB — RPR: RPR Ser Ql: NONREACTIVE

## 2021-01-26 ENCOUNTER — Other Ambulatory Visit: Payer: Self-pay | Admitting: Certified Nurse Midwife

## 2021-02-06 ENCOUNTER — Encounter: Payer: Self-pay | Admitting: Certified Nurse Midwife

## 2021-02-06 ENCOUNTER — Ambulatory Visit (INDEPENDENT_AMBULATORY_CARE_PROVIDER_SITE_OTHER): Payer: Medicaid Other | Admitting: Certified Nurse Midwife

## 2021-02-06 ENCOUNTER — Other Ambulatory Visit: Payer: Self-pay

## 2021-02-06 VITALS — BP 116/77 | HR 75 | Wt 253.3 lb

## 2021-02-06 DIAGNOSIS — O99013 Anemia complicating pregnancy, third trimester: Secondary | ICD-10-CM

## 2021-02-06 DIAGNOSIS — Z3A30 30 weeks gestation of pregnancy: Secondary | ICD-10-CM

## 2021-02-06 DIAGNOSIS — Z3483 Encounter for supervision of other normal pregnancy, third trimester: Secondary | ICD-10-CM

## 2021-02-06 LAB — POCT URINALYSIS DIPSTICK OB
Bilirubin, UA: NEGATIVE
Blood, UA: NEGATIVE
Glucose, UA: NEGATIVE
Ketones, UA: NEGATIVE
Leukocytes, UA: NEGATIVE
Nitrite, UA: NEGATIVE
POC,PROTEIN,UA: NEGATIVE
Spec Grav, UA: 1.01 (ref 1.010–1.025)
Urobilinogen, UA: 0.2 E.U./dL
pH, UA: 7.5 (ref 5.0–8.0)

## 2021-02-06 NOTE — Progress Notes (Signed)
OB-Pt present for routine prenatal care. Pt stated that she was having lower back pain and possible braxton hick contractions.

## 2021-02-06 NOTE — Progress Notes (Signed)
ROB- Doing well overall, reports lower back pain and fatigue. Encouraged belly support. Folate, Iron, and B-12 labs today, see orders. Anticipatory guidance regarding course of prenatal care. Reviewed red flag symptoms and when to call. RTC x 2 weeks for ROB with ANNIE and x 4 weeks for ROB with JML or sooner if needed.   Juliann Pares, Student-[facility] [employer] 02/06/21 9:29 AM

## 2021-02-06 NOTE — Patient Instructions (Signed)
Back Pain in Pregnancy Back pain during pregnancy is common. Back pain may be caused by several factors that are related to changes during your pregnancy. Follow these instructions at home: Managing pain, stiffness, and swelling  If directed, for sudden (acute) back pain, put ice on the painful area. ? Put ice in a plastic bag. ? Place a towel between your skin and the bag. ? Leave the ice on for 20 minutes, 2-3 times per day.  If directed, apply heat to the affected area before you exercise. Use the heat source that your health care provider recommends, such as a moist heat pack or a heating pad. ? Place a towel between your skin and the heat source. ? Leave the heat on for 20-30 minutes. ? Remove the heat if your skin turns bright red. This is especially important if you are unable to feel pain, heat, or cold. You may have a greater risk of getting burned.  If directed, massage the affected area.      Activity  Exercise as told by your health care provider. Gentle exercise is the best way to prevent or manage back pain.  Listen to your body when lifting. If lifting hurts, ask for help or bend your knees. This uses your leg muscles instead of your back muscles.  Squat down when picking up something from the floor. Do not bend over.  Only use bed rest for short periods as told by your health care provider. Bed rest should only be used for the most severe episodes of back pain. Standing, sitting, and lying down  Do not stand in one place for long periods of time.  Use good posture when sitting. Make sure your head rests over your shoulders and is not hanging forward. Use a pillow on your lower back if necessary.  Try sleeping on your side, preferably the left side, with a pregnancy support pillow or 1-2 regular pillows between your legs. ? If you have back pain after a night's rest, your bed may be too soft. ? A firm mattress may provide more support for your back during  pregnancy. General instructions  Do not wear high heels.  Eat a healthy diet. Try to gain weight within your health care provider's recommendations.  Use a maternity girdle, elastic sling, or back brace as told by your health care provider.  Take over-the-counter and prescription medicines only as told by your health care provider.  Work with a physical therapist or massage therapist to find ways to manage back pain. Acupuncture or massage therapy may be helpful.  Keep all follow-up visits as told by your health care provider. This is important. Contact a health care provider if:  Your back pain interferes with your daily activities.  You have increasing pain in other parts of your body. Get help right away if:  You develop numbness, tingling, weakness, or problems with the use of your arms or legs.  You develop severe back pain that is not controlled with medicine.  You have a change in bowel or bladder control.  You develop shortness of breath, dizziness, or you faint.  You develop nausea, vomiting, or sweating.  You have back pain that is a rhythmic, cramping pain similar to labor pains. Labor pain is usually 1-2 minutes apart, lasts for about 1 minute, and involves a bearing down feeling or pressure in your pelvis.  You have back pain and your water breaks or you have vaginal bleeding.  You have back pain or  numbness that travels down your leg.  Your back pain developed after you fell.  You develop pain on one side of your back.  You see blood in your urine.  You develop skin blisters in the area of your back pain. Summary  Back pain may be caused by several factors that are related to changes during your pregnancy.  Follow instructions as told by your health care provider for managing pain, stiffness, and swelling.  Exercise as told by your health care provider. Gentle exercise is the best way to prevent or manage back pain.  Take over-the-counter and  prescription medicines only as told by your health care provider.  Keep all follow-up visits as told by your health care provider. This is important. This information is not intended to replace advice given to you by your health care provider. Make sure you discuss any questions you have with your health care provider. Document Revised: 03/13/2019 Document Reviewed: 05/10/2018 Elsevier Patient Education  [place]. Common Medications Safe in Pregnancy  Acne:      Constipation:  Benzoyl Peroxide     Colace  Clindamycin      Dulcolax Suppository  Topica Erythromycin     Fibercon  Salicylic Acid      Metamucil         Miralax AVOID:        Senakot   Accutane    Cough:  Retin-A       Cough Drops  Tetracycline      Phenergan w/ Codeine if Rx  Minocycline      Robitussin (Plain & DM)  Antibiotics:     Crabs/Lice:  Ceclor       RID  Cephalosporins    AVOID:  E-Mycins      Kwell  Keflex  Macrobid/Macrodantin   Diarrhea:  Penicillin      Kao-Pectate  Zithromax      Imodium AD         PUSH FLUIDS AVOID:       Cipro     Fever:  Tetracycline      Tylenol (Regular or Extra  Minocycline       Strength)  Levaquin      Extra Strength-Do not          Exceed 8 tabs/24 hrs Caffeine:        <242m/day (equiv. To 1 cup of coffee or  approx. 3 12 oz sodas)         Gas: Cold/Hayfever:       Gas-X  Benadryl      Mylicon  Claritin       Phazyme  **Claritin-D        Chlor-Trimeton    Headaches:  Dimetapp      ASA-Free Excedrin  Drixoral-Non-Drowsy     Cold Compress  Mucinex (Guaifenasin)     Tylenol (Regular or Extra  Sudafed/Sudafed-12 Hour     Strength)  **Sudafed PE Pseudoephedrine   Tylenol Cold & Sinus     Vicks Vapor Rub  Zyrtec  **AVOID if Problems With Blood Pressure         Heartburn: Avoid lying down for at least 1 hour after meals  Aciphex      Maalox     Rash:  Milk of Magnesia     Benadryl    Mylanta       1% Hydrocortisone Cream  Pepcid  Pepcid  Complete   Sleep Aids:  Prevacid      Ambien  Prilosec       Benadryl  Rolaids       Chamomile Tea  Tums (Limit 4/day)     Unisom         Tylenol PM         Warm milk-add vanilla or  Hemorrhoids:       Sugar for taste  Anusol/Anusol H.C.  (RX: Analapram 2.5%)  Sugar Substitutes:  Hydrocortisone OTC     Ok in moderation  Preparation H      Tucks        Vaseline lotion applied to tissue with wiping    Herpes:     Throat:  Acyclovir      Oragel  Famvir  Valtrex     Vaccines:         Flu Shot Leg Cramps:       *Gardasil  Benadryl      Hepatitis A         Hepatitis B Nasal Spray:       Pneumovax  Saline Nasal Spray     Polio Booster         Tetanus Nausea:       Tuberculosis test or PPD  Vitamin B6 25 mg TID   AVOID:    Dramamine      *Gardasil  Emetrol       Live Poliovirus  Ginger Root 250 mg QID    MMR (measles, mumps &  High Complex Carbs @ Bedtime    rebella)  Sea Bands-Accupressure    Varicella (Chickenpox)  Unisom 1/2 tab TID     *No known complications           If received before Pain:         Known pregnancy;   Darvocet       Resume series after  Lortab        Delivery  Percocet    Yeast:   Tramadol      Femstat  Tylenol 3      Gyne-lotrimin  Ultram       Monistat  Vicodin           MISC:         All Sunscreens           Hair Coloring/highlights          Insect Repellant's          (Including DEET)         Mystic Tans Breastfeeding  Choosing to breastfeed is one of the best decisions you can make for yourself and your baby. A change in hormones during pregnancy causes your breasts to make breast milk in your milk-producing glands. Hormones prevent breast milk from being released before your baby is born. They also prompt milk flow after birth. Once breastfeeding has begun, thoughts of your baby, as well as his or her sucking or crying, can stimulate the release of milk from your milk-producing glands. Benefits of breastfeeding Research shows that  breastfeeding offers many health benefits for infants and mothers. It also offers a cost-free and convenient way to feed your baby. For your baby  Your first milk (colostrum) helps your baby's digestive system to function better.  Special cells in your milk (antibodies) help your baby to fight off infections.  Breastfed babies are less likely to develop asthma, allergies, obesity, or type 2 diabetes. They are also at lower risk for sudden infant death syndrome (SIDS).  Nutrients in breast milk are better able to  meet your baby's needs compared to infant formula.  Breast milk improves your baby's brain development. For you  Breastfeeding helps to create a very special bond between you and your baby.  Breastfeeding is convenient. Breast milk costs nothing and is always available at the correct temperature.  Breastfeeding helps to burn calories. It helps you to lose the weight that you gained during pregnancy.  Breastfeeding makes your uterus return faster to its size before pregnancy. It also slows bleeding (lochia) after you give birth.  Breastfeeding helps to lower your risk of developing type 2 diabetes, osteoporosis, rheumatoid arthritis, cardiovascular disease, and breast, ovarian, uterine, and endometrial cancer later in life. Breastfeeding basics Starting breastfeeding  Find a comfortable place to sit or lie down, with your neck and back well-supported.  Place a pillow or a rolled-up blanket under your baby to bring him or her to the level of your breast (if you are seated). Nursing pillows are specially designed to help support your arms and your baby while you breastfeed.  Make sure that your baby's tummy (abdomen) is facing your abdomen.  Gently massage your breast. With your fingertips, massage from the outer edges of your breast inward toward the nipple. This encourages milk flow. If your milk flows slowly, you may need to continue this action during the feeding.  Support  your breast with 4 fingers underneath and your thumb above your nipple (make the letter "C" with your hand). Make sure your fingers are well away from your nipple and your baby's mouth.  Stroke your baby's lips gently with your finger or nipple.  When your baby's mouth is open wide enough, quickly bring your baby to your breast, placing your entire nipple and as much of the areola as possible into your baby's mouth. The areola is the colored area around your nipple. ? More areola should be visible above your baby's upper lip than below the lower lip. ? Your baby's lips should be opened and extended outward (flanged) to ensure an adequate, comfortable latch. ? Your baby's tongue should be between his or her lower gum and your breast.  Make sure that your baby's mouth is correctly positioned around your nipple (latched). Your baby's lips should create a seal on your breast and be turned out (everted).  It is common for your baby to suck about 2-3 minutes in order to start the flow of breast milk. Latching Teaching your baby how to latch onto your breast properly is very important. An improper latch can cause nipple pain, decreased milk supply, and poor weight gain in your baby. Also, if your baby is not latched onto your nipple properly, he or she may swallow some air during feeding. This can make your baby fussy. Burping your baby when you switch breasts during the feeding can help to get rid of the air. However, teaching your baby to latch on properly is still the best way to prevent fussiness from swallowing air while breastfeeding. Signs that your baby has successfully latched onto your nipple  Silent tugging or silent sucking, without causing you pain. Infant's lips should be extended outward (flanged).  Swallowing heard between every 3-4 sucks once your milk has started to flow (after your let-down milk reflex occurs).  Muscle movement above and in front of his or her ears while  sucking. Signs that your baby has not successfully latched onto your nipple  Sucking sounds or smacking sounds from your baby while breastfeeding.  Nipple pain. If you think your  baby has not latched on correctly, slip your finger into the corner of your baby's mouth to break the suction and place it between your baby's gums. Attempt to start breastfeeding again. Signs of successful breastfeeding Signs from your baby  Your baby will gradually decrease the number of sucks or will completely stop sucking.  Your baby will fall asleep.  Your baby's body will relax.  Your baby will retain a small amount of milk in his or her mouth.  Your baby will let go of your breast by himself or herself. Signs from you  Breasts that have increased in firmness, weight, and size 1-3 hours after feeding.  Breasts that are softer immediately after breastfeeding.  Increased milk volume, as well as a change in milk consistency and color by the fifth day of breastfeeding.  Nipples that are not sore, cracked, or bleeding. Signs that your baby is getting enough milk  Wetting at least 1-2 diapers during the first 24 hours after birth.  Wetting at least 5-6 diapers every 24 hours for the first week after birth. The urine should be clear or pale yellow by the age of 5 days.  Wetting 6-8 diapers every 24 hours as your baby continues to grow and develop.  At least 3 stools in a 24-hour period by the age of 5 days. The stool should be soft and yellow.  At least 3 stools in a 24-hour period by the age of 7 days. The stool should be seedy and yellow.  No loss of weight greater than 10% of birth weight during the first 3 days of life.  Average weight gain of 4-7 oz (113-198 g) per week after the age of 4 days.  Consistent daily weight gain by the age of 5 days, without weight loss after the age of 2 weeks. After a feeding, your baby may spit up a small amount of milk. This is normal. Breastfeeding  frequency and duration Frequent feeding will help you make more milk and can prevent sore nipples and extremely full breasts (breast engorgement). Breastfeed when you feel the need to reduce the fullness of your breasts or when your baby shows signs of hunger. This is called "breastfeeding on demand." Signs that your baby is hungry include:  Increased alertness, activity, or restlessness.  Movement of the head from side to side.  Opening of the mouth when the corner of the mouth or cheek is stroked (rooting).  Increased sucking sounds, smacking lips, cooing, sighing, or squeaking.  Hand-to-mouth movements and sucking on fingers or hands.  Fussing or crying. Avoid introducing a pacifier to your baby in the first 4-6 weeks after your baby is born. After this time, you may choose to use a pacifier. Research has shown that pacifier use during the first year of a baby's life decreases the risk of sudden infant death syndrome (SIDS). Allow your baby to feed on each breast as long as he or she wants. When your baby unlatches or falls asleep while feeding from the first breast, offer the second breast. Because newborns are often sleepy in the first few weeks of life, you may need to awaken your baby to get him or her to feed. Breastfeeding times will vary from baby to baby. However, the following rules can serve as a guide to help you make sure that your baby is properly fed:  Newborns (babies 36 weeks of age or younger) may breastfeed every 1-3 hours.  Newborns should not go without breastfeeding for longer  than 3 hours during the day or 5 hours during the night.  You should breastfeed your baby a minimum of 8 times in a 24-hour period. Breast milk pumping Pumping and storing breast milk allows you to make sure that your baby is exclusively fed your breast milk, even at times when you are unable to breastfeed. This is especially important if you go back to work while you are still breastfeeding, or  if you are not able to be present during feedings. Your lactation consultant can help you find a method of pumping that works best for you and give you guidelines about how long it is safe to store breast milk.      Caring for your breasts while you breastfeed Nipples can become dry, cracked, and sore while breastfeeding. The following recommendations can help keep your breasts moisturized and healthy:  Avoid using soap on your nipples.  Wear a supportive bra designed especially for nursing. Avoid wearing underwire-style bras or extremely tight bras (sports bras).  Air-dry your nipples for 3-4 minutes after each feeding.  Use only cotton bra pads to absorb leaked breast milk. Leaking of breast milk between feedings is normal.  Use lanolin on your nipples after breastfeeding. Lanolin helps to maintain your skin's normal moisture barrier. Pure lanolin is not harmful (not toxic) to your baby. You may also hand express a few drops of breast milk and gently massage that milk into your nipples and allow the milk to air-dry. In the first few weeks after giving birth, some women experience breast engorgement. Engorgement can make your breasts feel heavy, warm, and tender to the touch. Engorgement peaks within 3-5 days after you give birth. The following recommendations can help to ease engorgement:  Completely empty your breasts while breastfeeding or pumping. You may want to start by applying warm, moist heat (in the shower or with warm, water-soaked hand towels) just before feeding or pumping. This increases circulation and helps the milk flow. If your baby does not completely empty your breasts while breastfeeding, pump any extra milk after he or she is finished.  Apply ice packs to your breasts immediately after breastfeeding or pumping, unless this is too uncomfortable for you. To do this: ? Put ice in a plastic bag. ? Place a towel between your skin and the bag. ? Leave the ice on for 20 minutes,  2-3 times a day.  Make sure that your baby is latched on and positioned properly while breastfeeding. If engorgement persists after 48 hours of following these recommendations, contact your health care provider or a Science writer. Overall health care recommendations while breastfeeding  Eat 3 healthy meals and 3 snacks every day. Well-nourished mothers who are breastfeeding need an additional 450-500 calories a day. You can meet this requirement by increasing the amount of a balanced diet that you eat.  Drink enough water to keep your urine pale yellow or clear.  Rest often, relax, and continue to take your prenatal vitamins to prevent fatigue, stress, and low vitamin and mineral levels in your body (nutrient deficiencies).  Do not use any products that contain nicotine or tobacco, such as cigarettes and e-cigarettes. Your baby may be harmed by chemicals from cigarettes that pass into breast milk and exposure to secondhand smoke. If you need help quitting, ask your health care provider.  Avoid alcohol.  Do not use illegal drugs or marijuana.  Talk with your health care provider before taking any medicines. These include over-the-counter and prescription medicines as  well as vitamins and herbal supplements. Some medicines that may be harmful to your baby can pass through breast milk.  It is possible to become pregnant while breastfeeding. If birth control is desired, ask your health care provider about options that will be safe while breastfeeding your baby. Where to find more information: [employer]: www.llli.org Contact a health care provider if:  You feel like you want to stop breastfeeding or have become frustrated with breastfeeding.  Your nipples are cracked or bleeding.  Your breasts are red, tender, or warm.  You have: ? Painful breasts or nipples. ? A swollen area on either breast. ? A fever or chills. ? Nausea or vomiting. ? Drainage other than  breast milk from your nipples.  Your breasts do not become full before feedings by the fifth day after you give birth.  You feel sad and depressed.  Your baby is: ? Too sleepy to eat well. ? Having trouble sleeping. ? More than 40 week old and wetting fewer than 6 diapers in a 24-hour period. ? Not gaining weight by 56 days of age.  Your baby has fewer than 3 stools in a 24-hour period.  Your baby's skin or the white parts of his or her eyes become yellow. Get help right away if:  Your baby is overly tired (lethargic) and does not want to wake up and feed.  Your baby develops an unexplained fever. Summary  Breastfeeding offers many health benefits for infant and mothers.  Try to breastfeed your infant when he or she shows early signs of hunger.  Gently tickle or stroke your baby's lips with your finger or nipple to allow the baby to open his or her mouth. Bring the baby to your breast. Make sure that much of the areola is in your baby's mouth. Offer one side and burp the baby before you offer the other side.  Talk with your health care provider or lactation consultant if you have questions or you face problems as you breastfeed. This information is not intended to replace advice given to you by your health care provider. Make sure you discuss any questions you have with your health care provider. Document Revised: 02/16/2018 Document Reviewed: 12/24/2016 Elsevier Patient Education  2021 [employer].

## 2021-02-06 NOTE — Progress Notes (Signed)
I have seen, interviewed, and examined the patient in conjunction with the [facility] [employer] [facility] and affirm the diagnosis and management plan.   Gunnar Bulla, CNM [facility], [facility] 02/06/21 9:56 AM

## 2021-02-07 LAB — IRON,TIBC AND FERRITIN PANEL
Ferritin: 34 ng/mL (ref 15–150)
Iron Saturation: 15 % (ref 15–55)
Iron: 59 ug/dL (ref 27–159)
Total Iron Binding Capacity: 386 ug/dL (ref 250–450)
UIBC: 327 ug/dL (ref 131–425)

## 2021-02-07 LAB — B12 AND FOLATE PANEL
Folate: >20 ng/mL (ref >3.0)
Vitamin B-12: 454 pg/mL (ref 232–1245)

## 2021-02-18 ENCOUNTER — Encounter: Payer: Self-pay | Admitting: Certified Nurse Midwife

## 2021-02-18 ENCOUNTER — Ambulatory Visit (INDEPENDENT_AMBULATORY_CARE_PROVIDER_SITE_OTHER): Payer: Medicaid Other | Admitting: Certified Nurse Midwife

## 2021-02-18 ENCOUNTER — Other Ambulatory Visit: Payer: Self-pay

## 2021-02-18 VITALS — BP 115/71 | HR 85 | Wt 257.3 lb

## 2021-02-18 DIAGNOSIS — Z3483 Encounter for supervision of other normal pregnancy, third trimester: Secondary | ICD-10-CM

## 2021-02-18 DIAGNOSIS — Z3A32 32 weeks gestation of pregnancy: Secondary | ICD-10-CM

## 2021-02-18 LAB — POCT URINALYSIS DIPSTICK OB
Bilirubin, UA: NEGATIVE
Blood, UA: NEGATIVE
Glucose, UA: NEGATIVE
Ketones, UA: NEGATIVE
Leukocytes, UA: NEGATIVE
Nitrite, UA: NEGATIVE
Odor: NEGATIVE
POC,PROTEIN,UA: NEGATIVE
Spec Grav, UA: 1.01 (ref 1.010–1.025)
Urobilinogen, UA: 0.2 E.U./dL
pH, UA: 8 (ref 5.0–8.0)

## 2021-02-18 NOTE — Patient Instructions (Signed)
Fetal Movement Counts Patient Name: ________________________________________________ Patient Due Date: ____________________  What is a fetal movement count? A fetal movement count is the number of times that you feel your baby move during a certain amount of time. This may also be called a fetal kick count. A fetal movement count is recommended for every pregnant woman. You may be asked to start counting fetal movements as early as week 28 of your pregnancy. Pay attention to when your baby is most active. You may notice your baby's sleep and wake cycles. You may also notice things that make your baby move more. You should do a fetal movement count:  When your baby is normally most active.  At the same time each day. A good time to count movements is while you are resting, after having something to eat and drink. How do I count fetal movements? 1. Find a quiet, comfortable area. Sit, or lie down on your side. 2. Write down the date, the start time and stop time, and the number of movements that you felt between those two times. Take this information with you to your health care visits. 3. Write down your start time when you feel the first movement. 4. Count kicks, flutters, swishes, rolls, and jabs. You should feel at least 10 movements. 5. You may stop counting after you have felt 10 movements, or if you have been counting for 2 hours. Write down the stop time. 6. If you do not feel 10 movements in 2 hours, contact your health care provider for further instructions. Your health care provider may want to do additional tests to assess your baby's well-being. Contact a health care provider if:  You feel fewer than 10 movements in 2 hours.  Your baby is not moving like he or she usually does. Date: ____________ Start time: ____________ Stop time: ____________ Movements: ____________ Date: ____________ Start time: ____________ Stop time: ____________ Movements: ____________ Date: ____________  Start time: ____________ Stop time: ____________ Movements: ____________ Date: ____________ Start time: ____________ Stop time: ____________ Movements: ____________ Date: ____________ Start time: ____________ Stop time: ____________ Movements: ____________ Date: ____________ Start time: ____________ Stop time: ____________ Movements: ____________ Date: ____________ Start time: ____________ Stop time: ____________ Movements: ____________ Date: ____________ Start time: ____________ Stop time: ____________ Movements: ____________ Date: ____________ Start time: ____________ Stop time: ____________ Movements: ____________ This information is not intended to replace advice given to you by your health care provider. Make sure you discuss any questions you have with your health care provider. Document Revised: 07/12/2019 Document Reviewed: 07/12/2019 Elsevier Patient Education  2021 [employer] Inc[employer]  

## 2021-02-18 NOTE — Progress Notes (Signed)
ROB- no complaints.  

## 2021-02-18 NOTE — Progress Notes (Signed)
ROB doing well. Feels good movement. Discussed kick counts . All questions answered. Follow up 2 wk with Marcelino Duster.   Doreene Burke, CNM

## 2021-03-05 ENCOUNTER — Ambulatory Visit (INDEPENDENT_AMBULATORY_CARE_PROVIDER_SITE_OTHER): Payer: Medicaid Other | Admitting: Certified Nurse Midwife

## 2021-03-05 ENCOUNTER — Other Ambulatory Visit: Payer: Self-pay

## 2021-03-05 ENCOUNTER — Observation Stay
Admission: EM | Admit: 2021-03-05 | Discharge: 2021-03-05 | Disposition: A | Discharge status: Home / Self Care | Payer: Medicaid Other | Attending: Obstetrics and Gynecology | Admitting: Obstetrics and Gynecology

## 2021-03-05 ENCOUNTER — Encounter: Payer: Self-pay | Admitting: Obstetrics and Gynecology

## 2021-03-05 VITALS — BP 109/74 | HR 79 | Wt 255.2 lb

## 2021-03-05 DIAGNOSIS — Z3A34 34 weeks gestation of pregnancy: Secondary | ICD-10-CM

## 2021-03-05 DIAGNOSIS — O4193X1 Disorder of amniotic fluid and membranes, unspecified, third trimester, fetus 1: Secondary | ICD-10-CM | POA: Diagnosis present

## 2021-03-05 DIAGNOSIS — Z3403 Encounter for supervision of normal first pregnancy, third trimester: Secondary | ICD-10-CM

## 2021-03-05 DIAGNOSIS — O26893 Other specified pregnancy related conditions, third trimester: Secondary | ICD-10-CM | POA: Diagnosis not present

## 2021-03-05 DIAGNOSIS — Z3490 Encounter for supervision of normal pregnancy, unspecified, unspecified trimester: Secondary | ICD-10-CM

## 2021-03-05 LAB — POCT URINALYSIS DIPSTICK OB
Appearance: ABNORMAL
Bilirubin, UA: NEGATIVE
Blood, UA: POSITIVE
Glucose, UA: NEGATIVE
Ketones, UA: NEGATIVE
Leukocytes, UA: NEGATIVE
Nitrite, UA: NEGATIVE
Odor: NORMAL
Spec Grav, UA: 1.01 (ref 1.010–1.025)
Urobilinogen, UA: 0.2 E.U./dL
pH, UA: 7.5 (ref 5.0–8.0)

## 2021-03-05 LAB — RUPTURE OF MEMBRANE (ROM)PLUS: Rom Plus: NEGATIVE

## 2021-03-05 NOTE — Progress Notes (Signed)
ROB she thinks she lost her mucous plug on Saturday. Otherwise she is doing well.

## 2021-03-05 NOTE — Progress Notes (Addendum)
ROB doing well overall, reports losing her mucus plug on Saturday and occasional braxton hicks contractions. Discussed home treatment measures for management of symptoms. Fetus transverse on palpation, three (3) [employer] handouts provided. Anticipatory guidance regarding course of prenatal care. Reviewed red flag symptoms and when to call. RTC x 2 weeks for 36 week cultures and ROB with ANNIE; RTC x 4 weeks for ROB with JML or sooner if needed.  Juliann Pares, Student-MidWife [employer] 03/05/21 9:13 AM

## 2021-03-05 NOTE — Discharge Instructions (Signed)
Return to hospital for any of the following:  -contractions at least 5 minutes apart -leakage of fluid -decreased fetal movement  -vaginal bleeding    

## 2021-03-05 NOTE — Patient Instructions (Signed)
Rosen's Emergency Medicine: Concepts and Clinical Practice (9th ed., pp. 2296- 2312). Elsevier.">  Braxton Hicks Contractions Contractions of the uterus can occur throughout pregnancy, but they are not always a sign that you are in labor. You may have practice contractions called Braxton Hicks contractions. These false labor contractions are sometimes confused with true labor. What are Braxton Hicks contractions? Braxton Hicks contractions are tightening movements that occur in the muscles of the uterus before labor. Unlike true labor contractions, these contractions do not result in opening (dilation) and thinning of the cervix. Toward the end of pregnancy (32-34 weeks), Braxton Hicks contractions can happen more often and may become stronger. These contractions are sometimes difficult to tell apart from true labor because they can be very uncomfortable. You should not feel embarrassed if you go to the hospital with false labor. Sometimes, the only way to tell if you are in true labor is for your health care provider to look for changes in the cervix. The health care provider will do a physical exam and may monitor your contractions. If you are not in true labor, the exam should show that your cervix is not dilating and your water has not broken. If there are no other health problems associated with your pregnancy, it is completely safe for you to be sent home with false labor. You may continue to have Braxton Hicks contractions until you go into true labor. How to tell the difference between true labor and false labor True labor  Contractions last 30-70 seconds.  Contractions become very regular.  Discomfort is usually felt in the top of the uterus, and it spreads to the lower abdomen and low back.  Contractions do not go away with walking.  Contractions usually become more intense and increase in frequency.  The cervix dilates and gets thinner. False labor  Contractions are usually shorter  and not as strong as true labor contractions.  Contractions are usually irregular.  Contractions are often felt in the front of the lower abdomen and in the groin.  Contractions may go away when you walk around or change positions while lying down.  Contractions get weaker and are shorter-lasting as time goes on.  The cervix usually does not dilate or become thin. Follow these instructions at home:  Take over-the-counter and prescription medicines only as told by your health care provider.  Keep up with your usual exercises and follow other instructions from your health care provider.  Eat and drink lightly if you think you are going into labor.  If Braxton Hicks contractions are making you uncomfortable: ? Change your position from lying down or resting to walking, or change from walking to resting. ? Sit and rest in a tub of warm water. ? Drink enough fluid to keep your urine pale yellow. Dehydration may cause these contractions. ? Do slow and deep breathing several times an hour.  Keep all follow-up prenatal visits as told by your health care provider. This is important.   Contact a health care provider if:  You have a fever.  You have continuous pain in your abdomen. Get help right away if:  Your contractions become stronger, more regular, and closer together.  You have fluid leaking or gushing from your vagina.  You pass blood-tinged mucus (bloody show).  You have bleeding from your vagina.  You have low back pain that you never had before.  You feel your baby's head pushing down and causing pelvic pressure.  Your baby is not moving inside   you as much as it used to. Summary  Contractions that occur before labor are called Braxton Hicks contractions, false labor, or practice contractions.  Braxton Hicks contractions are usually shorter, weaker, farther apart, and less regular than true labor contractions. True labor contractions usually become progressively  stronger and regular, and they become more frequent.  Manage discomfort from Braxton Hicks contractions by changing position, resting in a warm bath, drinking plenty of water, or practicing deep breathing. This information is not intended to replace advice given to you by your health care provider. Make sure you discuss any questions you have with your health care provider. Document Revised: 11/04/2017 Document Reviewed: 04/07/2017 Elsevier Patient Education  2021 Elsevier Inc.   Fetal Movement Counts Patient Name: ________________________________________________ Patient Due Date: ____________________  What is a fetal movement count? A fetal movement count is the number of times that you feel your baby move during a certain amount of time. This may also be called a fetal kick count. A fetal movement count is recommended for every pregnant woman. You may be asked to start counting fetal movements as early as week 28 of your pregnancy. Pay attention to when your baby is most active. You may notice your baby's sleep and wake cycles. You may also notice things that make your baby move more. You should do a fetal movement count:  When your baby is normally most active.  At the same time each day. A good time to count movements is while you are resting, after having something to eat and drink. How do I count fetal movements? 1. Find a quiet, comfortable area. Sit, or lie down on your side. 2. Write down the date, the start time and stop time, and the number of movements that you felt between those two times. Take this information with you to your health care visits. 3. Write down your start time when you feel the first movement. 4. Count kicks, flutters, swishes, rolls, and jabs. You should feel at least 10 movements. 5. You may stop counting after you have felt 10 movements, or if you have been counting for 2 hours. Write down the stop time. 6. If you do not feel 10 movements in 2 hours, contact  your health care provider for further instructions. Your health care provider may want to do additional tests to assess your baby's well-being. Contact a health care provider if:  You feel fewer than 10 movements in 2 hours.  Your baby is not moving like he or she usually does. Date: ____________ Start time: ____________ Stop time: ____________ Movements: ____________ Date: ____________ Start time: ____________ Stop time: ____________ Movements: ____________ Date: ____________ Start time: ____________ Stop time: ____________ Movements: ____________ Date: ____________ Start time: ____________ Stop time: ____________ Movements: ____________ Date: ____________ Start time: ____________ Stop time: ____________ Movements: ____________ Date: ____________ Start time: ____________ Stop time: ____________ Movements: ____________ Date: ____________ Start time: ____________ Stop time: ____________ Movements: ____________ Date: ____________ Start time: ____________ Stop time: ____________ Movements: ____________ Date: ____________ Start time: ____________ Stop time: ____________ Movements: ____________ This information is not intended to replace advice given to you by your health care provider. Make sure you discuss any questions you have with your health care provider. Document Revised: 07/12/2019 Document Reviewed: 07/12/2019 Elsevier Patient Education  2021 Elsevier Inc.   Third Trimester of Pregnancy  The third trimester of pregnancy is from week 28 through week 40. This is also called months 7 through 9. This trimester is when your unborn baby (fetus)   is growing very fast. At the end of the ninth month, the unborn baby is about 20 inches long. It weighs about 6-10 pounds. Body changes during your third trimester Your body continues to go through many changes during this time. The changes vary and generally return to normal after the baby is born. Physical changes  Your weight will continue to  increase. You may gain 25-35 pounds (11-16 kg) by the end of the pregnancy. If you are underweight, you may gain 28-40 lb (about 13-18 kg). If you are overweight, you may gain 15-25 lb (about 7-11 kg).  You may start to get stretch marks on your hips, belly (abdomen), and breasts.  Your breasts will continue to grow and may hurt. A yellow fluid (colostrum) may leak from your breasts. This is the first milk you are making for your baby.  You may have changes in your hair.  Your belly button may stick out.  You may have more swelling in your hands, face, or ankles. Health changes  You may have heartburn.  You may have trouble pooping (constipation).  You may get hemorrhoids. These are swollen veins in the butt that can itch or get painful.  You may have swollen veins (varicose veins) in your legs.  You may have more body aches in the pelvis, back, or thighs.  You may have more tingling or numbness in your hands, arms, and legs. The skin on your belly may also feel numb.  You may feel short of breath as your womb (uterus) gets bigger. Other changes  You may pee (urinate) more often.  You may have more problems sleeping.  You may notice the unborn baby "dropping," or moving lower in your belly.  You may have more discharge coming from your vagina.  Your joints may feel loose, and you may have pain around your pelvic bone. Follow these instructions at home: Medicines  Take over-the-counter and prescription medicines only as told by your doctor. Some medicines are not safe during pregnancy.  Take a prenatal vitamin that contains at least 600 micrograms (mcg) of folic acid. Eating and drinking  Eat healthy meals that include: ? Fresh fruits and vegetables. ? Whole grains. ? Good sources of protein, such as meat, eggs, or tofu. ? Low-fat dairy products.  Avoid raw meat and unpasteurized juice, milk, and cheese. These carry germs that can harm you and your baby.  Eat 4 or  5 small meals rather than 3 large meals a day.  You may need to take these actions to prevent or treat trouble pooping: ? Drink enough fluids to keep your pee (urine) pale yellow. ? Eat foods that are high in fiber. These include beans, whole grains, and fresh fruits and vegetables. ? Limit foods that are high in fat and sugar. These include fried or sweet foods. Activity  Exercise only as told by your doctor. Stop exercising if you start to have cramps in your womb.  Avoid heavy lifting.  Do not exercise if it is too hot or too humid, or if you are in a place of great height (high altitude).  If you choose to, you may have sex unless your doctor tells you not to. Relieving pain and discomfort  Take breaks often, and rest with your legs raised (elevated) if you have leg cramps or low back pain.  Take warm water baths (sitz baths) to soothe pain or discomfort caused by hemorrhoids. Use hemorrhoid cream if your doctor approves.  Wear a good   support bra if your breasts are tender.  If you develop bulging, swollen veins in your legs: ? Wear support hose as told by your doctor. ? Raise your feet for 15 minutes, 3-4 times a day. ? Limit salt in your food. Safety  Talk to your doctor before traveling far distances.  Do not use hot tubs, steam rooms, or saunas.  Wear your seat belt at all times when you are in a car.  Talk with your doctor if someone is hurting you or yelling at you a lot. Preparing for your baby's arrival To prepare for the arrival of your baby:  Take prenatal classes.  Visit the hospital and tour the maternity area.  Buy a rear-facing car seat. Learn how to install it in your car.  Prepare the baby's room. Take out all pillows and stuffed animals from the baby's crib. General instructions  Avoid cat litter boxes and soil used by cats. These carry germs that can cause harm to the baby and can cause a loss of your baby by miscarriage or stillbirth.  Do not  douche or use tampons. Do not use scented sanitary pads.  Do not smoke or use any products that contain nicotine or tobacco. If you need help quitting, ask your doctor.  Do not drink alcohol.  Do not use herbal medicines, illegal drugs, or medicines that were not approved by your doctor. Chemicals in these products can affect your baby.  Keep all follow-up visits. This is important. Where to find more information  American Pregnancy Association: americanpregnancy.org  American College of Obstetricians and Gynecologists: www.acog.org  Office on Women's Health: womenshealth.gov/pregnancy Contact a doctor if:  You have a fever.  You have mild cramps or pressure in your lower belly.  You have a nagging pain in your belly area.  You vomit, or you have watery poop (diarrhea).  You have bad-smelling fluid coming from your vagina.  You have pain when you pee, or your pee smells bad.  You have a headache that does not go away when you take medicine.  You have changes in how you see, or you see spots in front of your eyes. Get help right away if:  Your water breaks.  You have regular contractions that are less than 5 minutes apart.  You are spotting or bleeding from your vagina.  You have very bad belly cramps or pain.  You have trouble breathing.  You have chest pain.  You faint.  You have not felt the baby move for the amount of time told by your doctor.  You have new or increased pain, swelling, or redness in an arm or leg. Summary  The third trimester is from week 28 through week 40 (months 7 through 9). This is the time when your unborn baby is growing very fast.  During this time, your discomfort may increase as you gain weight and as your baby grows.  Get ready for your baby to arrive by taking prenatal classes, buying a rear-facing car seat, and preparing the baby's room.  Get help right away if you are bleeding from your vagina, you have chest pain and  trouble breathing, or you have not felt the baby move for the amount of time told by your doctor. This information is not intended to replace advice given to you by your health care provider. Make sure you discuss any questions you have with your health care provider. Document Revised: 04/30/2020 Document Reviewed: 03/06/2020 Elsevier Patient Education  2021 Elsevier   Inc.  

## 2021-03-05 NOTE — Progress Notes (Signed)
I have seen, interviewed, and examined the patient in conjunction with the [employer] and affirm the diagnosis and management plan.   Gunnar Bulla, CNM [facility], [facility] 03/05/21 11:05 AM

## 2021-03-05 NOTE — OB Triage Note (Signed)
Pt reports to unit c/o a gush of clear fluid that occurred around 1600 today, but no further leaking. Pt reports +FM, denies ctx and vaginal bleeding. Initial FHTs 140s. External monitors applied and assessing. Upon RN assessment, no LOF seen.

## 2021-03-05 NOTE — OB Triage Note (Signed)
Discharge instructions given and reviewed to patient and significant other. All questions answered. Patient discharged ambulatory in good condition.

## 2021-03-09 NOTE — Discharge Summary (Signed)
Obstetric Discharge Summary  Patient ID: Morgan Mann MRN: [id number] DOB/AGE: December 19, 1985 35 y.o.   Date of Admission: 03/05/2021  Date of Discharge: 03/05/2021  Admitting Diagnosis: Observation at [redacted]w[redacted]d  Secondary Diagnosis:   Patient Active Problem List   Diagnosis Date Noted  . Pregnancy 03/05/2021  . History of premature rupture of membranes 07/14/2019      Discharge Diagnosis: No other diagnosis   Antepartum Procedures: NST and ROM Plus   Brief Hospital Course   L&D OB Triage Note  Morgan Mann is a 35 y.o. [id number] female at [redacted]w[redacted]d, EDD Estimated Date of Delivery: 04/14/21 who presented to triage for complaints of leakage of fluid.  She was evaluated by the nurses with no significant findings for premature labor, preterm premature rupture of membranes or maternal/fetal distress. Vital signs stable. An NST was performed and has been reviewed by CNM. Her ROM Plus was negative.   NST INTERPRETATION: Indications: rule out uterine contractions  Mode: External Baseline Rate (A): 135 bpm Variability: Moderate Accelerations: 15 x 15 Decelerations: None   Contraction Frequency (min): none noted  Impression: reactive   Plan: NST performed was reviewed and was found to be reactive. She was discharged home with bleeding/labor precautions.  Continue routine prenatal care. Follow up with OB/GYN as previously scheduled.   Discharge Instructions: Per After Visit Summary  Activity:  Also refer to After Visit Summary  Diet: Regular  Medications: Allergies as of 03/05/2021   No Known Allergies     Medication List    ASK your doctor about these medications   acetaminophen 500 MG tablet Commonly known as: TYLENOL Take 500 mg by mouth every 6 (six) hours as needed.   Doxylamine-Pyridoxine 10-10 MG Tbec Commonly known as: Diclegis Take 2 tablets by mouth at bedtime. If symptoms persist, add one tablet in the morning and one in the afternoon   Fusion Plus Caps Take 1  tablet by mouth daily.   prenatal multivitamin Tabs tablet Take 1 tablet by mouth daily at 12 noon.      Outpatient follow up: As previously scheduled or sooner if needed  Postpartum contraception: bilateral tubal ligation  Discharged Condition: stable  Discharged to: home   Serafina Royals, CNM  [facility], [facility] 03/09/21 8:59 AM

## 2021-03-18 ENCOUNTER — Encounter: Payer: Self-pay | Admitting: Certified Nurse Midwife

## 2021-03-18 ENCOUNTER — Other Ambulatory Visit (HOSPITAL_COMMUNITY)
Admission: RE | Admit: 2021-03-18 | Discharge: 2021-03-18 | Disposition: A | Discharge status: Home / Self Care | Payer: Medicaid Other | Source: Ambulatory Visit | Attending: Certified Nurse Midwife | Admitting: Certified Nurse Midwife

## 2021-03-18 ENCOUNTER — Ambulatory Visit (INDEPENDENT_AMBULATORY_CARE_PROVIDER_SITE_OTHER): Payer: Medicaid Other | Admitting: Certified Nurse Midwife

## 2021-03-18 ENCOUNTER — Other Ambulatory Visit: Payer: Self-pay

## 2021-03-18 VITALS — BP 115/75 | HR 82 | Wt 258.5 lb

## 2021-03-18 DIAGNOSIS — Z3403 Encounter for supervision of normal first pregnancy, third trimester: Secondary | ICD-10-CM | POA: Diagnosis not present

## 2021-03-18 DIAGNOSIS — Z3A36 36 weeks gestation of pregnancy: Secondary | ICD-10-CM | POA: Diagnosis present

## 2021-03-18 LAB — POCT URINALYSIS DIPSTICK OB
Bilirubin, UA: NEGATIVE
Glucose, UA: NEGATIVE
Ketones, UA: NEGATIVE
Leukocytes, UA: NEGATIVE
Nitrite, UA: NEGATIVE
Spec Grav, UA: 1.01 (ref 1.010–1.025)
Urobilinogen, UA: 0.2 E.U./dL
pH, UA: 7.5 (ref 5.0–8.0)

## 2021-03-18 NOTE — Progress Notes (Signed)
ROB doing well. Feels movement, notes that it has changed. Discussed normal movements. Reviewed kick counts. She verbalizes understanding. She is concerned about discharge and "leaking of fluid" States she has continued to leak since being seen at [facility]. She denies any change to the discharge. Sterile spec exam today, no pooling, nitrazine negative. No fluid seen when coughing. White discharge noted, no odor. Discussed use of u/s to measure fluid if pt would like reassurance. She declines but will let me know if she continues to leak she may consider. Swabs collected today for GBS, gc, chlaymidia, will also check for trich, yeast and bv given her concern for discharge. She agree. Follow up 1 wk with Marcelino Duster.   Doreene Burke, CNM

## 2021-03-18 NOTE — Patient Instructions (Signed)
Fetal Movement Counts Patient Name: ________________________________________________ Patient Due Date: ____________________  What is a fetal movement count? A fetal movement count is the number of times that you feel your baby move during a certain amount of time. This may also be called a fetal kick count. A fetal movement count is recommended for every pregnant woman. You may be asked to start counting fetal movements as early as week 28 of your pregnancy. Pay attention to when your baby is most active. You may notice your baby's sleep and wake cycles. You may also notice things that make your baby move more. You should do a fetal movement count:  When your baby is normally most active.  At the same time each day. A good time to count movements is while you are resting, after having something to eat and drink. How do I count fetal movements? 1. Find a quiet, comfortable area. Sit, or lie down on your side. 2. Write down the date, the start time and stop time, and the number of movements that you felt between those two times. Take this information with you to your health care visits. 3. Write down your start time when you feel the first movement. 4. Count kicks, flutters, swishes, rolls, and jabs. You should feel at least 10 movements. 5. You may stop counting after you have felt 10 movements, or if you have been counting for 2 hours. Write down the stop time. 6. If you do not feel 10 movements in 2 hours, contact your health care provider for further instructions. Your health care provider may want to do additional tests to assess your baby's well-being. Contact a health care provider if:  You feel fewer than 10 movements in 2 hours.  Your baby is not moving like he or she usually does. Date: ____________ Start time: ____________ Stop time: ____________ Movements: ____________ Date: ____________ Start time: ____________ Stop time: ____________ Movements: ____________ Date: ____________  Start time: ____________ Stop time: ____________ Movements: ____________ Date: ____________ Start time: ____________ Stop time: ____________ Movements: ____________ Date: ____________ Start time: ____________ Stop time: ____________ Movements: ____________ Date: ____________ Start time: ____________ Stop time: ____________ Movements: ____________ Date: ____________ Start time: ____________ Stop time: ____________ Movements: ____________ Date: ____________ Start time: ____________ Stop time: ____________ Movements: ____________ Date: ____________ Start time: ____________ Stop time: ____________ Movements: ____________ This information is not intended to replace advice given to you by your health care provider. Make sure you discuss any questions you have with your health care provider. Document Revised: 07/12/2019 Document Reviewed: 07/12/2019 Elsevier Patient Education  2021 [employer] Inc[employer]  

## 2021-03-18 NOTE — Progress Notes (Signed)
ROB: She has concerns that she is having leaking and the baby is not moving as much because she is leaking. She evaluated on March 31 at [facility], no leakage.

## 2021-03-19 LAB — CERVICOVAGINAL ANCILLARY ONLY
Bacterial Vaginitis (gardnerella): POSITIVE — AB
Candida Glabrata: NEGATIVE
Candida Vaginitis: NEGATIVE
Chlamydia: NEGATIVE
Comment: NEGATIVE
Comment: NEGATIVE
Comment: NEGATIVE
Comment: NEGATIVE
Comment: NEGATIVE
Comment: NORMAL
Neisseria Gonorrhea: NEGATIVE
Trichomonas: NEGATIVE

## 2021-03-22 LAB — CULTURE, BETA STREP (GROUP B ONLY): Strep Gp B Culture: NEGATIVE

## 2021-03-23 ENCOUNTER — Other Ambulatory Visit: Payer: Self-pay | Admitting: Certified Nurse Midwife

## 2021-03-23 MED ORDER — METRONIDAZOLE 0.75 % VA GEL: 1.0000 | Freq: Every day | VAGINAL | 0 refills | Status: AC

## 2021-03-25 ENCOUNTER — Ambulatory Visit (INDEPENDENT_AMBULATORY_CARE_PROVIDER_SITE_OTHER): Payer: Medicaid Other | Admitting: Certified Nurse Midwife

## 2021-03-25 ENCOUNTER — Encounter: Payer: Self-pay | Admitting: Certified Nurse Midwife

## 2021-03-25 ENCOUNTER — Other Ambulatory Visit: Payer: Self-pay

## 2021-03-25 VITALS — BP 120/81 | HR 93 | Wt 258.7 lb

## 2021-03-25 DIAGNOSIS — Z3403 Encounter for supervision of normal first pregnancy, third trimester: Secondary | ICD-10-CM

## 2021-03-25 DIAGNOSIS — Z3A37 37 weeks gestation of pregnancy: Secondary | ICD-10-CM

## 2021-03-25 LAB — POCT URINALYSIS DIPSTICK OB
Bilirubin, UA: NEGATIVE
Blood, UA: NEGATIVE
Glucose, UA: NEGATIVE
Ketones, UA: NEGATIVE
Leukocytes, UA: NEGATIVE
Nitrite, UA: NEGATIVE
Spec Grav, UA: <=1.005 — AB (ref 1.010–1.025)
Urobilinogen, UA: 0.2 E.U./dL
pH, UA: 7.5 (ref 5.0–8.0)

## 2021-03-25 NOTE — Progress Notes (Signed)
ROB doing well. Feels good fetal movement. Discussed GBS and swab results. Herbal prep handout given. Reviewed labor precautions.  All questions answered follow up 1 wk for ROB with Marcelino Duster.   Doreene Burke, CNM

## 2021-03-25 NOTE — Progress Notes (Signed)
Patient comes in today for ROB visit. She has been having some contractions.  

## 2021-03-25 NOTE — Patient Instructions (Signed)

## 2021-04-02 ENCOUNTER — Encounter: Payer: Self-pay | Admitting: Certified Nurse Midwife

## 2021-04-02 ENCOUNTER — Other Ambulatory Visit: Payer: Self-pay

## 2021-04-02 ENCOUNTER — Ambulatory Visit (INDEPENDENT_AMBULATORY_CARE_PROVIDER_SITE_OTHER): Payer: Medicaid Other | Admitting: Certified Nurse Midwife

## 2021-04-02 VITALS — BP 129/87 | HR 83 | Wt 262.2 lb

## 2021-04-02 DIAGNOSIS — Z3483 Encounter for supervision of other normal pregnancy, third trimester: Secondary | ICD-10-CM

## 2021-04-02 DIAGNOSIS — Z3A38 38 weeks gestation of pregnancy: Secondary | ICD-10-CM

## 2021-04-02 LAB — POCT URINALYSIS DIPSTICK OB
Bilirubin, UA: NEGATIVE
Blood, UA: NEGATIVE
Glucose, UA: NEGATIVE
Ketones, UA: NEGATIVE
Leukocytes, UA: NEGATIVE
Nitrite, UA: NEGATIVE
POC,PROTEIN,UA: NEGATIVE
Spec Grav, UA: 1.01 (ref 1.010–1.025)
Urobilinogen, UA: 1 E.U./dL
pH, UA: 7.5 (ref 5.0–8.0)

## 2021-04-02 NOTE — Progress Notes (Signed)
ROB-Reports pelvic pain. Discussed home labor preparation techniques. Anticipatory guidance regarding course of prenatal care and postdates antenatal testing as indicated. Reviewed red flag symptoms and when to call. RTC x 1 week for ROB with ANNIE or sooner if needed.

## 2021-04-02 NOTE — Progress Notes (Signed)
OB-Pt present for routine prenatal care. Pt c/o pelvic pain.

## 2021-04-02 NOTE — Patient Instructions (Addendum)
Fetal Movement Counts Patient Name: ________________________________________________ Patient Due Date: ____________________  What is a fetal movement count? A fetal movement count is the number of times that you feel your baby move during a certain amount of time. This may also be called a fetal kick count. A fetal movement count is recommended for every pregnant woman. You may be asked to start counting fetal movements as early as week 28 of your pregnancy. Pay attention to when your baby is most active. You may notice your baby's sleep and wake cycles. You may also notice things that make your baby move more. You should do a fetal movement count:  When your baby is normally most active.  At the same time each day. A good time to count movements is while you are resting, after having something to eat and drink. How do I count fetal movements? 1. Find a quiet, comfortable area. Sit, or lie down on your side. 2. Write down the date, the start time and stop time, and the number of movements that you felt between those two times. Take this information with you to your health care visits. 3. Write down your start time when you feel the first movement. 4. Count kicks, flutters, swishes, rolls, and jabs. You should feel at least 10 movements. 5. You may stop counting after you have felt 10 movements, or if you have been counting for 2 hours. Write down the stop time. 6. If you do not feel 10 movements in 2 hours, contact your health care provider for further instructions. Your health care provider may want to do additional tests to assess your baby's well-being. Contact a health care provider if:  You feel fewer than 10 movements in 2 hours.  Your baby is not moving like he or she usually does. Date: ____________ Start time: ____________ Stop time: ____________ Movements: ____________ Date: ____________ Start time: ____________ Stop time: ____________ Movements: ____________ Date: ____________  Start time: ____________ Stop time: ____________ Movements: ____________ Date: ____________ Start time: ____________ Stop time: ____________ Movements: ____________ Date: ____________ Start time: ____________ Stop time: ____________ Movements: ____________ Date: ____________ Start time: ____________ Stop time: ____________ Movements: ____________ Date: ____________ Start time: ____________ Stop time: ____________ Movements: ____________ Date: ____________ Start time: ____________ Stop time: ____________ Movements: ____________ Date: ____________ Start time: ____________ Stop time: ____________ Movements: ____________ This information is not intended to replace advice given to you by your health care provider. Make sure you discuss any questions you have with your health care provider. Document Revised: 07/12/2019 Document Reviewed: 07/12/2019 Elsevier Patient Education  2021 [employer].   Common Medications Safe in Pregnancy  Acne:      Constipation:  Benzoyl Peroxide     Colace  Clindamycin      Dulcolax Suppository  Topica Erythromycin     Fibercon  Salicylic Acid      Metamucil         Miralax AVOID:        Senakot   Accutane    Cough:  Retin-A       Cough Drops  Tetracycline      Phenergan w/ Codeine if Rx  Minocycline      Robitussin (Plain & DM)  Antibiotics:     Crabs/Lice:  Ceclor       RID  Cephalosporins    AVOID:  E-Mycins      Kwell  Keflex  Macrobid/Macrodantin   Diarrhea:  Penicillin      Kao-Pectate  Zithromax  Imodium AD         PUSH FLUIDS AVOID:       Cipro     Fever:  Tetracycline      Tylenol (Regular or Extra  Minocycline       Strength)  Levaquin      Extra Strength-Do not          Exceed 8 tabs/24 hrs Caffeine:        <226m/day (equiv. To 1 cup of coffee or  approx. 3 12 oz  sodas)         Gas: Cold/Hayfever:       Gas-X  Benadryl      Mylicon  Claritin       Phazyme  **Claritin-D        Chlor-Trimeton    Headaches:  Dimetapp      ASA-Free Excedrin  Drixoral-Non-Drowsy     Cold Compress  Mucinex (Guaifenasin)     Tylenol (Regular or Extra  Sudafed/Sudafed-12 Hour     Strength)  **Sudafed PE Pseudoephedrine   Tylenol Cold & Sinus     Vicks Vapor Rub  Zyrtec  **AVOID if Problems With Blood Pressure         Heartburn: Avoid lying down for at least 1 hour after meals  Aciphex      Maalox     Rash:  Milk of Magnesia     Benadryl    Mylanta       1% Hydrocortisone Cream  Pepcid  Pepcid Complete   Sleep Aids:  Prevacid      Ambien   Prilosec       Benadryl  Rolaids       Chamomile Tea  Tums (Limit 4/day)     Unisom         Tylenol PM         Warm milk-add vanilla or  Hemorrhoids:       Sugar for taste  Anusol/Anusol H.C.  (RX: Analapram 2.5%)  Sugar Substitutes:  Hydrocortisone OTC     Ok in moderation  Preparation H      Tucks        Vaseline lotion applied to tissue with wiping    Herpes:     Throat:  Acyclovir      Oragel  Famvir  Valtrex     Vaccines:         Flu Shot Leg Cramps:       *Gardasil  Benadryl      Hepatitis A         Hepatitis B Nasal Spray:       Pneumovax  Saline Nasal Spray     Polio Booster         Tetanus Nausea:       Tuberculosis test or PPD  Vitamin B6 25 mg TID   AVOID:    Dramamine      *Gardasil  Emetrol       Live Poliovirus  Ginger Root 250 mg QID    MMR (measles, mumps &  High Complex Carbs @ Bedtime    rebella)  Sea Bands-Accupressure    Varicella (Chickenpox)  Unisom 1/2 tab TID     *No known complications           If received before Pain:         Known pregnancy;   Darvocet       Resume series after  Lortab        Delivery  Percocet  Yeast:   Tramadol      Femstat  Tylenol 3      Gyne-lotrimin  Ultram       Monistat  Vicodin           MISC:         All Sunscreens           Hair  Coloring/highlights          Insect Repellant's          (Including DEET)         Mystic Tans

## 2021-04-03 ENCOUNTER — Encounter: Payer: Self-pay | Admitting: Certified Nurse Midwife

## 2021-04-03 ENCOUNTER — Other Ambulatory Visit: Payer: Self-pay

## 2021-04-03 ENCOUNTER — Observation Stay
Admission: EM | Admit: 2021-04-03 | Discharge: 2021-04-03 | Disposition: A | Discharge status: Home / Self Care | Payer: Medicaid Other | Attending: Certified Nurse Midwife | Admitting: Certified Nurse Midwife

## 2021-04-03 DIAGNOSIS — O471 False labor at or after 37 completed weeks of gestation: Secondary | ICD-10-CM | POA: Diagnosis not present

## 2021-04-03 DIAGNOSIS — Z3A38 38 weeks gestation of pregnancy: Secondary | ICD-10-CM | POA: Diagnosis not present

## 2021-04-03 DIAGNOSIS — O368331 Maternal care for abnormalities of the fetal heart rate or rhythm, third trimester, fetus 1: Secondary | ICD-10-CM | POA: Diagnosis not present

## 2021-04-03 NOTE — Progress Notes (Signed)
CNM gave verbal order for patient to be discharged. RN at bedside to go over discharge instructions with patient and significant other. Patient verbalized understanding and discharged home in stable condition.

## 2021-04-03 NOTE — OB Triage Note (Signed)
    L&D OB Triage Note  SUBJECTIVE Morgan Mann is a 35 y.o. [id number] female at [redacted]w[redacted]d, EDD Estimated Date of Delivery: 04/14/21 who presented to triage with complaints of contractions. Denies leaking of fluid and vaginal bleeding.    OB History  Gravida Para Term Preterm AB Living  6 2 2  0 3 2  SAB IAB Ectopic Multiple Live Births  0 3 0 0 2    # Outcome Date GA Lbr Len/2nd Weight Sex Delivery Anes PTL Lv  6 Current           5 Term 07/15/19 [redacted]w[redacted]d 11:00 / 00:14 2570 g F Vag-Spont None  LIV     Birth Comments: dark mongolian spot left shoulder, buttocks     Name: Jewel     Apgar1: 8  Apgar5: 9  4 IAB 2015          3 IAB 2010          2 IAB 2009          1 Term 11/18/05 [redacted]w[redacted]d  2920 g M Vag-Spont EPI N LIV     Name: [redacted]w[redacted]d    Medications Prior to Admission  Medication Sig Dispense Refill Last Dose  . acetaminophen (TYLENOL) 500 MG tablet Take 500 mg by mouth every 6 (six) hours as needed.   Past Month at Unknown time  . Iron-FA-B Cmp-C-Biot-Probiotic (FUSION PLUS) CAPS Take 1 tablet by mouth daily. 30 capsule 6 04/02/2021 at Unknown time  . Prenatal Vit-Fe Fumarate-FA (PRENATAL MULTIVITAMIN) TABS tablet Take 1 tablet by mouth daily at 12 noon.   04/02/2021 at Unknown time  . Doxylamine-Pyridoxine (DICLEGIS) 10-10 MG TBEC Take 2 tablets by mouth at bedtime. If symptoms persist, add one tablet in the morning and one in the afternoon 100 tablet 5      OBJECTIVE  Nursing Evaluation:   BP 126/71 (BP Location: Right Arm)   Pulse 79   Resp 16   Ht 5\' 8"  (1.727 m)   Wt 118.8 kg   LMP 07/08/2020 (Exact Date)   BMI 39.84 kg/m    Findings:        Irregular contractions, no cervical change      NST was performed and has been reviewed by me.  NST INTERPRETATION: Category I  Mode: External Baseline Rate (A): 135 bpm (FHT) Variability: Moderate Accelerations: 15 x 15 Decelerations: None     Contraction Frequency (min): 3-5  ASSESSMENT Impression:  1.  Pregnancy:  at  [redacted]w[redacted]d , EDD Estimated Date of Delivery: 04/14/21 2.  Reassuring fetal and maternal status 3.  No cervical change 2 hrs  PLAN 1. Discussed current condition and above findings with patient and reassurance given.  All questions answered. 2. Discharge home with standard labor precautions given to return to L&D or call the office for problems. 3. Continue routine prenatal care.  [redacted]w[redacted]d, CNM

## 2021-04-03 NOTE — OB Triage Note (Signed)
Patient G6P2 [redacted]w[redacted]d presents to L&D with complaints of contractions every since 0255 this morning. Patient denies vaginal bleeding, leaking of fluid, vaginal discharge, and reports positive fetal movement. Vital signs stable. Monitors applied and assessing.

## 2021-04-08 ENCOUNTER — Ambulatory Visit (INDEPENDENT_AMBULATORY_CARE_PROVIDER_SITE_OTHER): Payer: Medicaid Other | Admitting: Certified Nurse Midwife

## 2021-04-08 ENCOUNTER — Other Ambulatory Visit: Payer: Self-pay

## 2021-04-08 ENCOUNTER — Encounter: Payer: Self-pay | Admitting: Certified Nurse Midwife

## 2021-04-08 VITALS — BP 108/78 | HR 93 | Wt 262.3 lb

## 2021-04-08 DIAGNOSIS — Z3403 Encounter for supervision of normal first pregnancy, third trimester: Secondary | ICD-10-CM

## 2021-04-08 DIAGNOSIS — Z3A39 39 weeks gestation of pregnancy: Secondary | ICD-10-CM

## 2021-04-08 LAB — POCT URINALYSIS DIPSTICK OB
Bilirubin, UA: NEGATIVE
Glucose, UA: NEGATIVE
Ketones, UA: NEGATIVE
Leukocytes, UA: NEGATIVE
Nitrite, UA: NEGATIVE
Spec Grav, UA: 1.01 (ref 1.010–1.025)
Urobilinogen, UA: 0.2 E.U./dL
pH, UA: 7.5 (ref 5.0–8.0)

## 2021-04-08 NOTE — Progress Notes (Signed)
ROB: She has lost her mucous plug, she has been leaking fluid. Otherwise she feels good.

## 2021-04-08 NOTE — Progress Notes (Signed)
ROB doing well, feels good movement. Labor precautions reviewed. SVE per pt reques 3/70/-2. Follow up for NST and rob with michelle 1 wk.    Doreene Burke, CNM

## 2021-04-08 NOTE — Patient Instructions (Signed)

## 2021-04-16 ENCOUNTER — Ambulatory Visit (INDEPENDENT_AMBULATORY_CARE_PROVIDER_SITE_OTHER): Payer: Medicaid Other | Admitting: Certified Nurse Midwife

## 2021-04-16 ENCOUNTER — Inpatient Hospital Stay: Payer: Medicaid Other | Admitting: Anesthesiology

## 2021-04-16 ENCOUNTER — Encounter: Payer: Self-pay | Admitting: Certified Nurse Midwife

## 2021-04-16 ENCOUNTER — Other Ambulatory Visit: Payer: Self-pay

## 2021-04-16 ENCOUNTER — Inpatient Hospital Stay
Admission: EM | Admit: 2021-04-16 | Discharge: 2021-04-18 | DRG: 798 | Disposition: A | Discharge status: Home / Self Care | Payer: Medicaid Other | Attending: Certified Nurse Midwife | Admitting: Certified Nurse Midwife

## 2021-04-16 ENCOUNTER — Other Ambulatory Visit: Payer: Medicaid Other

## 2021-04-16 ENCOUNTER — Encounter: Payer: Self-pay | Admitting: Obstetrics and Gynecology

## 2021-04-16 VITALS — BP 111/75 | HR 80

## 2021-04-16 DIAGNOSIS — Z302 Encounter for sterilization: Secondary | ICD-10-CM

## 2021-04-16 DIAGNOSIS — O4292 Full-term premature rupture of membranes, unspecified as to length of time between rupture and onset of labor: Principal | ICD-10-CM | POA: Diagnosis present

## 2021-04-16 DIAGNOSIS — Z3483 Encounter for supervision of other normal pregnancy, third trimester: Secondary | ICD-10-CM | POA: Diagnosis not present

## 2021-04-16 DIAGNOSIS — O26893 Other specified pregnancy related conditions, third trimester: Secondary | ICD-10-CM | POA: Diagnosis present

## 2021-04-16 DIAGNOSIS — Z20822 Contact with and (suspected) exposure to covid-19: Secondary | ICD-10-CM | POA: Diagnosis present

## 2021-04-16 DIAGNOSIS — Z3A40 40 weeks gestation of pregnancy: Secondary | ICD-10-CM

## 2021-04-16 DIAGNOSIS — Z3689 Encounter for other specified antenatal screening: Secondary | ICD-10-CM

## 2021-04-16 DIAGNOSIS — Z3403 Encounter for supervision of normal first pregnancy, third trimester: Secondary | ICD-10-CM

## 2021-04-16 DIAGNOSIS — O99214 Obesity complicating childbirth: Secondary | ICD-10-CM | POA: Diagnosis present

## 2021-04-16 LAB — FETAL NONSTRESS TEST

## 2021-04-16 LAB — POCT URINALYSIS DIPSTICK OB
Bilirubin, UA: NEGATIVE
Glucose, UA: NEGATIVE
Ketones, UA: NEGATIVE
Leukocytes, UA: NEGATIVE
Nitrite, UA: NEGATIVE
Spec Grav, UA: 1.01 (ref 1.010–1.025)
Urobilinogen, UA: 0.2 E.U./dL
pH, UA: 7.5 (ref 5.0–8.0)

## 2021-04-16 LAB — CBC
HCT: 35.6 % — ABNORMAL LOW (ref 36.0–46.0)
Hemoglobin: 12.3 g/dL (ref 12.0–15.0)
MCH: 31.3 pg (ref 26.0–34.0)
MCHC: 34.6 g/dL (ref 30.0–36.0)
MCV: 90.6 fL (ref 80.0–100.0)
Platelets: 316 10*3/uL (ref 150–400)
RBC: 3.93 MIL/uL (ref 3.87–5.11)
RDW: 13.5 % (ref 11.5–15.5)
WBC: 6.3 10*3/uL (ref 4.0–10.5)
nRBC: 0 % (ref 0.0–0.2)

## 2021-04-16 LAB — RESP PANEL BY RT-PCR (FLU A&B, COVID) ARPGX2
Influenza A by PCR: NEGATIVE
Influenza B by PCR: NEGATIVE
SARS Coronavirus 2 by RT PCR: NEGATIVE

## 2021-04-16 LAB — TYPE AND SCREEN
ABO/RH(D): O POS
Antibody Screen: NEGATIVE

## 2021-04-16 MED ORDER — OXYTOCIN-SODIUM CHLORIDE 30-0.9 UT/500ML-% IV SOLN: 2.5000 [IU]/h | INTRAVENOUS | Status: DC

## 2021-04-16 MED ORDER — FAMOTIDINE 20 MG PO TABS
40.0000 mg | ORAL_TABLET | Freq: Once | ORAL | Status: AC
  Administered 2021-04-17: 40 mg via ORAL
  Filled 2021-04-16: qty 2

## 2021-04-16 MED ORDER — ONDANSETRON HCL 4 MG/2ML IJ SOLN: 4.0000 mg | INTRAMUSCULAR | Status: DC | PRN

## 2021-04-16 MED ORDER — LACTATED RINGERS IV SOLN: INTRAVENOUS | Status: DC

## 2021-04-16 MED ORDER — DIPHENHYDRAMINE HCL 25 MG PO CAPS: 25.0000 mg | ORAL_CAPSULE | Freq: Four times a day (QID) | ORAL | Status: DC | PRN

## 2021-04-16 MED ORDER — METHYLERGONOVINE MALEATE 0.2 MG/ML IJ SOLN: 0.2000 mg | INTRAMUSCULAR | Status: DC | PRN

## 2021-04-16 MED ORDER — MISOPROSTOL 200 MCG PO TABS
ORAL_TABLET | ORAL | Status: AC
  Filled 2021-04-16: qty 4

## 2021-04-16 MED ORDER — SODIUM CHLORIDE 0.9% FLUSH
3.0000 mL | Freq: Two times a day (BID) | INTRAVENOUS | Status: DC
  Administered 2021-04-16 – 2021-04-17 (×2): 3 mL via INTRAVENOUS

## 2021-04-16 MED ORDER — TERBUTALINE SULFATE 1 MG/ML IJ SOLN: 0.2500 mg | Freq: Once | INTRAMUSCULAR | Status: DC | PRN

## 2021-04-16 MED ORDER — AMMONIA AROMATIC IN INHA
RESPIRATORY_TRACT | Status: AC
  Filled 2021-04-16: qty 10

## 2021-04-16 MED ORDER — DIBUCAINE (PERIANAL) 1 % EX OINT: 1.0000 "application " | TOPICAL_OINTMENT | CUTANEOUS | Status: DC | PRN

## 2021-04-16 MED ORDER — FENTANYL 2.5 MCG/ML W/ROPIVACAINE 0.15% IN NS 100 ML EPIDURAL (ARMC)
EPIDURAL | Status: AC
  Filled 2021-04-16: qty 100

## 2021-04-16 MED ORDER — METOCLOPRAMIDE HCL 10 MG PO TABS
10.0000 mg | ORAL_TABLET | Freq: Once | ORAL | Status: AC
  Administered 2021-04-17: 10 mg via ORAL
  Filled 2021-04-16: qty 1

## 2021-04-16 MED ORDER — ACETAMINOPHEN 325 MG PO TABS
650.0000 mg | ORAL_TABLET | ORAL | Status: DC | PRN
  Administered 2021-04-17 (×2): 650 mg via ORAL
  Filled 2021-04-16 (×3): qty 2

## 2021-04-16 MED ORDER — OXYTOCIN 10 UNIT/ML IJ SOLN
INTRAMUSCULAR | Status: AC
  Filled 2021-04-16: qty 2

## 2021-04-16 MED ORDER — LACTATED RINGERS IV SOLN
500.0000 mL | INTRAVENOUS | Status: DC | PRN
Start: 2021-04-16 — End: 2021-04-16
  Administered 2021-04-16: 500 mL via INTRAVENOUS

## 2021-04-16 MED ORDER — FENTANYL 2.5 MCG/ML W/ROPIVACAINE 0.15% IN NS 100 ML EPIDURAL (ARMC)
EPIDURAL | Status: DC | PRN
  Administered 2021-04-16: 12 mL/h via EPIDURAL

## 2021-04-16 MED ORDER — MISOPROSTOL 50MCG HALF TABLET: 50.0000 ug | ORAL_TABLET | ORAL | Status: DC

## 2021-04-16 MED ORDER — PRENATAL MULTIVITAMIN CH
1.0000 | ORAL_TABLET | Freq: Every day | ORAL | Status: DC
  Administered 2021-04-17: 1 via ORAL
  Filled 2021-04-16: qty 1

## 2021-04-16 MED ORDER — METHYLERGONOVINE MALEATE 0.2 MG PO TABS: 0.2000 mg | ORAL_TABLET | ORAL | Status: DC | PRN

## 2021-04-16 MED ORDER — SIMETHICONE 80 MG PO CHEW
80.0000 mg | CHEWABLE_TABLET | ORAL | Status: DC | PRN
  Administered 2021-04-17 (×2): 80 mg via ORAL
  Filled 2021-04-16 (×2): qty 1

## 2021-04-16 MED ORDER — LIDOCAINE HCL (PF) 1 % IJ SOLN
INTRAMUSCULAR | Status: DC | PRN
  Administered 2021-04-16: 3 mL via SUBCUTANEOUS

## 2021-04-16 MED ORDER — SENNOSIDES-DOCUSATE SODIUM 8.6-50 MG PO TABS
2.0000 | ORAL_TABLET | ORAL | Status: DC
  Administered 2021-04-16 – 2021-04-17 (×2): 2 via ORAL
  Filled 2021-04-16 (×2): qty 2

## 2021-04-16 MED ORDER — BUPIVACAINE HCL (PF) 0.25 % IJ SOLN
INTRAMUSCULAR | Status: DC | PRN
  Administered 2021-04-16 (×2): 5 mL via EPIDURAL

## 2021-04-16 MED ORDER — WITCH HAZEL-GLYCERIN EX PADS: 1.0000 "application " | MEDICATED_PAD | CUTANEOUS | Status: DC | PRN

## 2021-04-16 MED ORDER — OXYCODONE-ACETAMINOPHEN 5-325 MG PO TABS: 1.0000 | ORAL_TABLET | ORAL | Status: DC | PRN

## 2021-04-16 MED ORDER — ONDANSETRON HCL 4 MG/2ML IJ SOLN: 4.0000 mg | Freq: Four times a day (QID) | INTRAMUSCULAR | Status: DC | PRN

## 2021-04-16 MED ORDER — LIDOCAINE-EPINEPHRINE (PF) 1.5 %-1:200000 IJ SOLN
INTRAMUSCULAR | Status: DC | PRN
  Administered 2021-04-16: 3 mL via EPIDURAL

## 2021-04-16 MED ORDER — ONDANSETRON HCL 4 MG PO TABS: 4.0000 mg | ORAL_TABLET | ORAL | Status: DC | PRN

## 2021-04-16 MED ORDER — SOD CITRATE-CITRIC ACID 500-334 MG/5ML PO SOLN: 30.0000 mL | ORAL | Status: DC | PRN

## 2021-04-16 MED ORDER — SODIUM CHLORIDE 0.9 % IV SOLN: 250.0000 mL | INTRAVENOUS | Status: DC | PRN

## 2021-04-16 MED ORDER — ACETAMINOPHEN 325 MG PO TABS: 650.0000 mg | ORAL_TABLET | ORAL | Status: DC | PRN

## 2021-04-16 MED ORDER — BUTORPHANOL TARTRATE 1 MG/ML IJ SOLN
1.0000 mg | INTRAMUSCULAR | Status: DC | PRN
  Administered 2021-04-16: 1 mg via INTRAVENOUS
  Filled 2021-04-16: qty 1

## 2021-04-16 MED ORDER — OXYTOCIN BOLUS FROM INFUSION
333.0000 mL | Freq: Once | INTRAVENOUS | Status: AC
  Administered 2021-04-16: 333 mL via INTRAVENOUS

## 2021-04-16 MED ORDER — ZOLPIDEM TARTRATE 5 MG PO TABS: 5.0000 mg | ORAL_TABLET | Freq: Every evening | ORAL | Status: DC | PRN

## 2021-04-16 MED ORDER — OXYTOCIN-SODIUM CHLORIDE 30-0.9 UT/500ML-% IV SOLN
1.0000 m[IU]/min | INTRAVENOUS | Status: DC
  Administered 2021-04-16: 2 m[IU]/min via INTRAVENOUS
  Filled 2021-04-16: qty 500

## 2021-04-16 MED ORDER — SODIUM CHLORIDE 0.9% FLUSH: 3.0000 mL | INTRAVENOUS | Status: DC | PRN

## 2021-04-16 MED ORDER — IBUPROFEN 600 MG PO TABS
600.0000 mg | ORAL_TABLET | Freq: Four times a day (QID) | ORAL | Status: DC
  Administered 2021-04-16 – 2021-04-18 (×6): 600 mg via ORAL
  Filled 2021-04-16 (×6): qty 1

## 2021-04-16 MED ORDER — COCONUT OIL OIL: 1.0000 "application " | TOPICAL_OIL | Status: DC | PRN

## 2021-04-16 MED ORDER — LIDOCAINE HCL (PF) 1 % IJ SOLN: 30.0000 mL | INTRAMUSCULAR | Status: DC | PRN

## 2021-04-16 MED ORDER — OXYCODONE-ACETAMINOPHEN 5-325 MG PO TABS: 2.0000 | ORAL_TABLET | ORAL | Status: DC | PRN

## 2021-04-16 MED ORDER — BENZOCAINE-MENTHOL 20-0.5 % EX AERO
1.0000 "application " | INHALATION_SPRAY | CUTANEOUS | Status: DC | PRN
  Filled 2021-04-16: qty 56

## 2021-04-16 MED ORDER — LIDOCAINE HCL (PF) 1 % IJ SOLN
INTRAMUSCULAR | Status: AC
  Filled 2021-04-16: qty 30

## 2021-04-16 NOTE — Progress Notes (Signed)
ROB-Ready to have baby. NST today reactive, see chart. Request SVE and membrane sweep. Accidental AROM with membrane sweep, nitrazine positive. Limited exam. Referred to Labor and Delivery for Admission. Report called to Jacki Cones, Charity fundraiser.

## 2021-04-16 NOTE — Anesthesia Procedure Notes (Signed)
Epidural Patient location during procedure: OB Start time: 04/16/2021 1:41 PM End time: 04/16/2021 2:10 PM  Staffing Anesthesiologist: Naomie Dean, MD Resident/CRNA: Irving Burton, CRNA Performed: resident/CRNA   Preanesthetic Checklist Completed: patient identified, IV checked, site marked, risks and benefits discussed, surgical consent, monitors and equipment checked, pre-op evaluation and timeout performed  Epidural Patient position: sitting Prep: ChloraPrep Patient monitoring: heart rate, continuous pulse ox and blood pressure Approach: midline Location: L3-L4 Injection technique: LOR air  Needle:  Needle type: Tuohy  Needle gauge: 17 G Needle length: 9 cm and 9 Needle insertion depth: 8 cm Catheter type: closed end flexible Catheter size: 19 Gauge Catheter at skin depth: 12 cm Test dose: negative and 1.5% lidocaine with Epi 1:200 K  Assessment Sensory level: T10 Events: blood not aspirated, injection not painful, no injection resistance, no paresthesia and negative IV test  Additional Notes 1 attempt Pt. Evaluated and documentation done after procedure finished. Patient identified. Risks/Benefits/Options discussed with patient including but not limited to bleeding, infection, nerve damage, paralysis, failed block, incomplete pain control, headache, blood pressure changes, nausea, vomiting, reactions to medication both or allergic, itching and postpartum back pain. Confirmed with bedside nurse the patient's most recent platelet count. Confirmed with patient that they are not currently taking any anticoagulation, have any bleeding history or any family history of bleeding disorders. Patient expressed understanding and wished to proceed. All questions were answered. Sterile technique was used throughout the entire procedure. Please see nursing notes for vital signs. Test dose was given through epidural catheter and negative prior to continuing to dose epidural or  start infusion. Warning signs of high block given to the patient including shortness of breath, tingling/numbness in hands, complete motor block, or any concerning symptoms with instructions to call for help. Patient was given instructions on fall risk and not to get out of bed. All questions and concerns addressed with instructions to call with any issues or inadequate analgesia.   Patient tolerated the insertion well without immediate complications.Reason for block:procedure for pain

## 2021-04-16 NOTE — H&P (Signed)
Obstetric History and Physical  Morgan Mann is a 35 y.o. [id number] with IUP at [redacted]w[redacted]d presenting with leakage of fluid after membrane sweep in office today.   Patient states she has been having  regular, every two (2) to five (5) minutes contractions, none vaginal bleeding, ruptured, clear fluid membranes, with active fetal movement.    Denies difficulty breathing or respiratory distress, chest pain, dysuria, and leg pain or swelling.   Prenatal Course  Source of Care: [facility]-initial visit at 9 weeks, total visits: 14  Pregnancy complications or risks: Obesity in pregnancy  Prenatal labs and studies:  ABO, Rh: O/Positive/-- 09-24-2023 1533)  Antibody: Negative Sep 24, 2023 1533)  Rubella: 12.60 Sep 24, 2023 1533)  Varicella: 1, 936 24-Sep-2023 1533)  RPR: Non Reactive (02/16 1111)   HBsAg: Negative 09/24/23 1533)   HIV: Non Reactive 09-24-23 1533)   WOE:HOZYYQMG/-- (04/13 1433)  1 hr Glucola: 69 (02/16 1111)  Genetic screening: Low risk female (10/27 1439)  Anatomy US: Complete, normal (01/04 1126)  Past Medical History:  Diagnosis Date  . Anemia     Past Surgical History:  Procedure Laterality Date  . EYE SURGERY     cosmetic surgery    OB History  Gravida Para Term Preterm AB Living  6 2 2   3 2   SAB IAB Ectopic Multiple Live Births    3   0 2    # Outcome Date GA Lbr Len/2nd Weight Sex Delivery Anes PTL Lv  6 Current           5 Term 07/15/19 [redacted]w[redacted]d 11:00 / 00:14 2570 g F Vag-Spont None  LIV     Birth Comments: dark mongolian spot left shoulder, buttocks  4 IAB 2015          3 IAB 2010          2 IAB 2009          1 Term 11/18/05 [redacted]w[redacted]d  2920 g M Vag-Spont EPI N LIV    Social History   Socioeconomic History  . Marital status: Single    Spouse name: Not on file  . Number of children: Not on file  . Years of education: Not on file  . Highest education level: Not on file  Occupational History  . Not on file  Tobacco Use  . Smoking status: Never Smoker  . Smokeless  tobacco: Never Used  Vaping Use  . Vaping Use: Never used  Substance and Sexual Activity  . Alcohol use: No  . Drug use: Never  . Sexual activity: Not Currently    Comment: BTL  Other Topics Concern  . Not on file  Social History Narrative  . Not on file   Social Determinants of Health   Financial Resource Strain: Not on file  Food Insecurity: Not on file  Transportation Needs: Not on file  Physical Activity: Not on file  Stress: Not on file  Social Connections: Not on file    Family History  Problem Relation Age of Onset  . Ovarian cancer Mother   . Healthy Father   . Breast cancer Neg Hx   . Colon cancer Neg Hx     Medications Prior to Admission  Medication Sig Dispense Refill Last Dose  . acetaminophen (TYLENOL) 500 MG tablet Take 500 mg by mouth every 6 (six) hours as needed.   04/15/2021 at Unknown time  . Doxylamine-Pyridoxine (DICLEGIS) 10-10 MG TBEC Take 2 tablets by mouth at bedtime. If symptoms persist, add one tablet  in the morning and one in the afternoon 100 tablet 5 04/15/2021 at Unknown time  . Iron-FA-B Cmp-C-Biot-Probiotic (FUSION PLUS) CAPS Take 1 tablet by mouth daily. 30 capsule 6 04/15/2021 at Unknown time  . Prenatal Vit-Fe Fumarate-FA (PRENATAL MULTIVITAMIN) TABS tablet Take 1 tablet by mouth daily at 12 noon.   04/15/2021 at Unknown time    No Known Allergies  Review of Systems: Negative except for what is mentioned in HPI.  Physical Exam:  Pulse Rate:  [65-80] 65 (05/12 1241) Resp:  [16] 16 (05/12 1241) BP: (111-132)/(75-85) 132/85 (05/12 1241)  GENERAL: Well-developed, well-nourished female in no acute distress.   LUNGS: Clear to auscultation bilaterally.   HEART: Regular rate and rhythm.  ABDOMEN: Soft, nontender, nondistended, gravid.  EXTREMITIES: Nontender, no edema, 2+ distal pulses.  Cervical Exam: Dilation: 4 Effacement (%): 70 Cervical Position: Posterior Station: -2 Presentation: Vertex Exam by:: Willodean Rosenthal, CNM  FHR  Category I  Contractions: Every four (4) to five (5) minutes, soft resting tone   Pertinent Labs/Studies:   Results for orders placed or performed in visit on 04/16/21 (from the past 24 hour(s))  POC Urinalysis Dipstick OB     Status: Abnormal   Collection Time: 04/16/21  9:46 AM  Result Value Ref Range   Color, UA     Clarity, UA     Glucose, UA Negative Negative   Bilirubin, UA Negative    Ketones, UA Negative    Spec Grav, UA 1.010 1.010 - 1.025   Blood, UA Trace    pH, UA 7.5 5.0 - 8.0   POC,PROTEIN,UA Trace Negative, Trace, Small (1+), Moderate (2+), Large (3+), 4+   Urobilinogen, UA 0.2 0.2 or 1.0 E.U./dL   Nitrite, UA Negative    Leukocytes, UA Negative Negative   Appearance     Odor      Assessment :  Morgan Mann is a 35 y.o. [id number] at [redacted]w[redacted]d being admitted for augmentation of labor, Rh positive, GBS negative   FHR Category I  Plan:  Admit to birthing suites, see orders.   Labor: Expectant management.  Induction/Augmentation as needed, per protocol  Desires epidural for pain management.   Hopeful for vaginal birth.   Dr. Logan Bores notified of admission and plan of care.    Gunnar Bulla, CNM [facility], [facility] 04/16/21 12:52 PM

## 2021-04-16 NOTE — Lactation Note (Signed)
This note was copied from a baby's chart. Lactation Consultation Note  Patient Name: Morgan Mann [id number] Date: 04/16/2021 Reason for consult: Initial assessment;L&D Initial assessment;Term Age:37 hours  Initial lactation consult in L&D. Mom is G6P3 SVD <1hr ago. Mom desires exclusive BF, and has previous BF experience of 61yr with last child (now 21 months).  Mom independently had baby in cross cradle with baby in good position/alignment, and sandwiching of breast tissue to encourage latch. Mom is concerned that baby is coming on/off the breast. LC reviewed learning suck/swallow/breathe pattern on top of learning to breast feed. Mom has well everted nipples and seems confident in breastfeeding.  LC briefly reviewed newborn stomach size, feeding patterns and behaviors, early feeding cues, benefits of skin to skin, and importance of feeding on demand. Encouraged 8-12 feeding attempts in first 24 hours, hand expression reviewed and encouraged between feedings and if sleepy. Reviewed output expectations in first 24 hours.   Mom has no questions at this time.  Maternal Data Has patient been taught Hand Expression?: Yes Does the patient have breastfeeding experience prior to this delivery?: Yes How long did the patient breastfeed?: 43yr (baby now 57 months old)  Feeding Mother's Current Feeding Choice: Breast Milk  LATCH Score Latch: Grasps breast easily, tongue down, lips flanged, rhythmical sucking.  Audible Swallowing: A few with stimulation  Type of Nipple: Everted at rest and after stimulation  Comfort (Breast/Nipple): Soft / non-tender  Hold (Positioning): No assistance needed to correctly position infant at breast.  LATCH Score: 9   Lactation Tools Discussed/Used    Interventions Interventions: Breast feeding basics reviewed;Assisted with latch;Support pillows;Education;Hand express  Discharge    Consult Status Consult Status: Follow-up Date: 04/17/21 Follow-up  type: In-patient    Danford Bad 04/16/2021, 4:43 PM

## 2021-04-16 NOTE — Progress Notes (Signed)
ROB Doing well, ready to have baby. She has no new concerns today.

## 2021-04-16 NOTE — Patient Instructions (Signed)
Augmentation of Labor Augmentation of labor is when steps are taken to stimulate and strengthen contractions of the uterus during labor. This may be done when contractions have slowed or stopped, and the progress of labor and delivery of the baby is delayed. Before augmentation of labor begins, these factors will be considered:  The mother's medical condition and the baby's condition.  The size and position of the baby.  The size of the birth canal. Tell a health care provider about:  Any allergies you have.  All medicines you are taking, including vitamins, herbs, eye drops, creams, and over-the-counter medicines.  Any problems you or your family members have had with anesthetic medicines.  Any surgeries you have had.  Any blood disorders you have.  Any medical conditions you have. What are the risks? Generally, this is a safe procedure. However, problems may occur, including:  Tearing (rupture) of the uterus.  Breaking off (abruption) of the placenta.  Increased risk of infection for you and your baby.  Increased risk of cesarean, forceps, or vacuum delivery.  Excessive bleeding after delivery (postpartum hemorrhage).  Umbilical cord prolapse. This can cause the umbilical cord to get squeezed during birth.  Too much stimulation of the contractions. This can result in continuous, prolonged, or very strong contractions.  Your baby could fail to get enough blood flow or oxygen. This can be life-threatening (fetal death). What happens during the procedure? Augmentation of labor may include:  Giving you medicine that stimulates contractions (oxytocin). This is given through an IV that is inserted into a vein in your arm.  Breaking the fluid-filled sac that surrounds the fetus (amniotic sac). This procedure is called artificial rupture of membranes. This procedure may vary among health care providers and hospitals.   Summary  Augmentation of labor is when steps are taken  to stimulate and strengthen contractions of the uterus during labor. This may be done when contractions have slowed down or stopped, and the progress of labor and delivery of the baby is delayed.  Labor augmentation may be done using medicine to stimulate contractions (oxytocin). Or it can be done by breaking the fluid-filled sac that surrounds the fetus (amniotic sac).  Generally, this is a safe procedure. However, problems can occur. Talk with your health care provider about the potential risks and benefits of labor augmentation if this is offered to you. This information is not intended to replace advice given to you by your health care provider. Make sure you discuss any questions you have with your health care provider. Document Revised: 09/04/2020 Document Reviewed: 09/04/2020 Elsevier Patient Education  2021 [employer].

## 2021-04-16 NOTE — Anesthesia Preprocedure Evaluation (Signed)
Anesthesia Evaluation  Patient identified by MRN, date of birth, ID band Patient awake    Reviewed: Allergy & Precautions, H&P , NPO status , Patient's Chart, lab work & pertinent test results  History of Anesthesia Complications Negative for: history of anesthetic complications  Airway Mallampati: II       Dental   Pulmonary           Cardiovascular      Neuro/Psych    GI/Hepatic   Endo/Other    Renal/GU      Musculoskeletal   Abdominal   Peds  Hematology  (+) Blood dyscrasia, anemia ,   Anesthesia Other Findings   Reproductive/Obstetrics (+) Pregnancy                             Anesthesia Physical Anesthesia Plan  ASA: II  Anesthesia Plan: Epidural   Post-op Pain Management:    Induction:   PONV Risk Score and Plan:   Airway Management Planned:   Additional Equipment:   Intra-op Plan:   Post-operative Plan:   Informed Consent: I have reviewed the patients History and Physical, chart, labs and discussed the procedure including the risks, benefits and alternatives for the proposed anesthesia with the patient or authorized representative who has indicated his/her understanding and acceptance.       Plan Discussed with: Anesthesiologist  Anesthesia Plan Comments:         Anesthesia Quick Evaluation

## 2021-04-17 ENCOUNTER — Inpatient Hospital Stay: Payer: Medicaid Other | Admitting: Anesthesiology

## 2021-04-17 ENCOUNTER — Encounter
Admission: EM | Disposition: A | Discharge status: Home / Self Care | Payer: Self-pay | Source: Home / Self Care | Attending: Certified Nurse Midwife

## 2021-04-17 ENCOUNTER — Encounter: Payer: Self-pay | Admitting: Certified Nurse Midwife

## 2021-04-17 DIAGNOSIS — Z302 Encounter for sterilization: Secondary | ICD-10-CM

## 2021-04-17 HISTORY — PX: TUBAL LIGATION: SHX77

## 2021-04-17 LAB — CBC
HCT: 35.3 % — ABNORMAL LOW (ref 36.0–46.0)
Hemoglobin: 11.9 g/dL — ABNORMAL LOW (ref 12.0–15.0)
MCH: 31 pg (ref 26.0–34.0)
MCHC: 33.7 g/dL (ref 30.0–36.0)
MCV: 91.9 fL (ref 80.0–100.0)
Platelets: 282 10*3/uL (ref 150–400)
RBC: 3.84 MIL/uL — ABNORMAL LOW (ref 3.87–5.11)
RDW: 13.8 % (ref 11.5–15.5)
WBC: 8.8 10*3/uL (ref 4.0–10.5)
nRBC: 0 % (ref 0.0–0.2)

## 2021-04-17 LAB — RPR: RPR Ser Ql: NONREACTIVE

## 2021-04-17 SURGERY — LIGATION, FALLOPIAN TUBE, POSTPARTUM
Anesthesia: General | Laterality: Bilateral

## 2021-04-17 MED ORDER — DEXAMETHASONE SODIUM PHOSPHATE 10 MG/ML IJ SOLN
INTRAMUSCULAR | Status: AC
  Filled 2021-04-17: qty 1

## 2021-04-17 MED ORDER — LIDOCAINE HCL (CARDIAC) PF 100 MG/5ML IV SOSY
PREFILLED_SYRINGE | INTRAVENOUS | Status: DC | PRN
  Administered 2021-04-17: 80 mg via INTRAVENOUS

## 2021-04-17 MED ORDER — DOCUSATE SODIUM 100 MG PO CAPS
100.0000 mg | ORAL_CAPSULE | Freq: Two times a day (BID) | ORAL | Status: DC
  Administered 2021-04-17: 100 mg via ORAL
  Filled 2021-04-17: qty 1

## 2021-04-17 MED ORDER — ONDANSETRON HCL 4 MG/2ML IJ SOLN
INTRAMUSCULAR | Status: DC | PRN
  Administered 2021-04-17: 4 mg via INTRAVENOUS

## 2021-04-17 MED ORDER — FENTANYL CITRATE (PF) 100 MCG/2ML IJ SOLN
25.0000 ug | INTRAMUSCULAR | Status: DC | PRN
  Administered 2021-04-17: 25 ug via INTRAVENOUS

## 2021-04-17 MED ORDER — MIDAZOLAM HCL 2 MG/2ML IJ SOLN
INTRAMUSCULAR | Status: AC
  Filled 2021-04-17: qty 2

## 2021-04-17 MED ORDER — BUPIVACAINE HCL (PF) 0.5 % IJ SOLN
INTRAMUSCULAR | Status: AC
  Filled 2021-04-17: qty 30

## 2021-04-17 MED ORDER — FENTANYL CITRATE (PF) 100 MCG/2ML IJ SOLN
INTRAMUSCULAR | Status: DC | PRN
  Administered 2021-04-17 (×2): 50 ug via INTRAVENOUS

## 2021-04-17 MED ORDER — ROCURONIUM BROMIDE 100 MG/10ML IV SOLN
INTRAVENOUS | Status: DC | PRN
  Administered 2021-04-17: 50 mg via INTRAVENOUS

## 2021-04-17 MED ORDER — BUPIVACAINE HCL 0.5 % IJ SOLN
INTRAMUSCULAR | Status: DC | PRN
  Administered 2021-04-17: 20 mL

## 2021-04-17 MED ORDER — PROPOFOL 10 MG/ML IV BOLUS
INTRAVENOUS | Status: DC | PRN
  Administered 2021-04-17: 150 mg via INTRAVENOUS

## 2021-04-17 MED ORDER — FENTANYL CITRATE (PF) 100 MCG/2ML IJ SOLN
INTRAMUSCULAR | Status: AC
  Administered 2021-04-17: 25 ug via INTRAVENOUS
  Filled 2021-04-17: qty 2

## 2021-04-17 MED ORDER — LIDOCAINE HCL (PF) 2 % IJ SOLN
INTRAMUSCULAR | Status: AC
  Filled 2021-04-17: qty 5

## 2021-04-17 MED ORDER — ROCURONIUM BROMIDE 10 MG/ML (PF) SYRINGE
PREFILLED_SYRINGE | INTRAVENOUS | Status: AC
  Filled 2021-04-17: qty 10

## 2021-04-17 MED ORDER — FENTANYL CITRATE (PF) 100 MCG/2ML IJ SOLN
INTRAMUSCULAR | Status: AC
  Filled 2021-04-17: qty 2

## 2021-04-17 MED ORDER — DEXAMETHASONE SODIUM PHOSPHATE 10 MG/ML IJ SOLN
INTRAMUSCULAR | Status: DC | PRN
  Administered 2021-04-17: 5 mg via INTRAVENOUS

## 2021-04-17 MED ORDER — SUGAMMADEX SODIUM 200 MG/2ML IV SOLN
INTRAVENOUS | Status: DC | PRN
  Administered 2021-04-17 (×2): 100 mg via INTRAVENOUS

## 2021-04-17 MED ORDER — ONDANSETRON HCL 4 MG/2ML IJ SOLN
INTRAMUSCULAR | Status: AC
  Filled 2021-04-17: qty 2

## 2021-04-17 MED ORDER — ONDANSETRON HCL 4 MG/2ML IJ SOLN: 4.0000 mg | Freq: Once | INTRAMUSCULAR | Status: DC | PRN

## 2021-04-17 SURGICAL SUPPLY — 28 items
ADH SKN CLS APL DERMABOND .7 (GAUZE/BANDAGES/DRESSINGS) ×1
APL PRP STRL LF DISP 70% ISPRP (MISCELLANEOUS) ×1
BINDER ABDOMINAL 12 ML 46-62 (SOFTGOODS) ×2 IMPLANT
BLADE SURG SZ11 CARB STEEL (BLADE) ×2 IMPLANT
CHLORAPREP W/TINT 26 (MISCELLANEOUS) ×2 IMPLANT
COVER WAND RF STERILE (DRAPES) ×2 IMPLANT
DERMABOND ADVANCED (GAUZE/BANDAGES/DRESSINGS) ×1
DERMABOND ADVANCED .7 DNX12 (GAUZE/BANDAGES/DRESSINGS) ×1 IMPLANT
DRAPE LAPAROTOMY 100X77 ABD (DRAPES) ×2 IMPLANT
GLOVE SURG ENC MOIS LTX SZ6.5 (GLOVE) ×4 IMPLANT
GLOVE SURG UNDER LTX SZ7 (GLOVE) ×4 IMPLANT
GOWN STRL REUS W/ TWL LRG LVL3 (GOWN DISPOSABLE) ×2 IMPLANT
GOWN STRL REUS W/TWL LRG LVL3 (GOWN DISPOSABLE) ×4
KIT TURNOVER CYSTO (KITS) ×2 IMPLANT
MANIFOLD NEPTUNE II (INSTRUMENTS) ×2 IMPLANT
NEEDLE HYPO 25GX1X1/2 BEV (NEEDLE) ×2 IMPLANT
NS IRRIG 500ML POUR BTL (IV SOLUTION) ×2 IMPLANT
PACK BASIN MINOR ARMC (MISCELLANEOUS) ×2 IMPLANT
SUT MNCRL 4-0 (SUTURE) ×2
SUT MNCRL 4-0 27XMFL (SUTURE) ×1
SUT PLAIN GUT 0 (SUTURE) ×4 IMPLANT
SUT VIC AB 0 CT1 36 (SUTURE) IMPLANT
SUT VIC AB 0 SH 27 (SUTURE) IMPLANT
SUT VIC AB 3-0 SH 27 (SUTURE) ×2
SUT VIC AB 3-0 SH 27X BRD (SUTURE) ×1 IMPLANT
SUT VICRYL 0 AB UR-6 (SUTURE) ×4 IMPLANT
SUTURE MNCRL 4-0 27XMF (SUTURE) ×1 IMPLANT
SYR 10ML LL (SYRINGE) ×2 IMPLANT

## 2021-04-17 NOTE — Transfer of Care (Signed)
Immediate Anesthesia Transfer of Care Note  Patient: Morgan Mann  Procedure(s) Performed: POST PARTUM TUBAL LIGATION (Bilateral )  Patient Location: PACU  Anesthesia Type:General  Level of Consciousness: awake  Airway & Oxygen Therapy: Patient Spontanous Breathing  Post-op Assessment: Report given to RN  Post vital signs: stable  Last Vitals:  Vitals Value Taken Time  BP 110/51 04/17/21 1408  Temp    Pulse 78 04/17/21 1414  Resp 21 04/17/21 1414  SpO2 99 % 04/17/21 1414  Vitals shown include unvalidated device data.  Last Pain:  Vitals:   04/17/21 1208  TempSrc: Temporal  PainSc: 0-No pain      Patients Stated Pain Goal: 0 (04/16/21 1258)  Complications: No complications documented.

## 2021-04-17 NOTE — Op Note (Signed)
Procedure(s): POST PARTUM TUBAL LIGATION Procedure Note  Morgan Mann female 35 y.o. 04/17/2021  Indications: The patient is a 35 y.o. [id number] female, desires permanent sterilization.   Pre-operative Diagnosis: Multiparity, desires permanent sterilization  Post-operative Diagnosis: Same  Surgeon: Hildred Laser, MD  Assistants:  None  Anesthesia: General endotracheal anesthesia  Findings: Normal uterus, fallopian tubes and ovaries.   Procedure Details: The patient was seen in the Holding Room. The risks, benefits, complications, treatment options, and expected outcomes were discussed with the patient.  The patient concurred with the proposed plan, giving informed consent.  The site of surgery properly noted/marked. The patient was taken to the Operating Room, identified as Morgan Mann and the procedure verified as Procedure(s) (LRB): POST PARTUM TUBAL LIGATION (Bilateral).   The patient was taken to the operating room where her she was placed under general anesthesia.  She was then placed in the dorsal supine position and prepped and draped in sterile fashion.   After an adequate timeout was performed, attention was turned to the patient's abdomen local analgesia was administered.  A small transverse skin incision was made under the umbilical fold. The incision was taken down to the layer of fascia using the scalpel, and fascia was incised, and extended bilaterally using Mayo scissors. The peritoneum was entered in a sharp fashion. Attention was then turned to the patient's uterus, and left fallopian tube was identified and followed out to the fimbriated end.  The Babcock clamp was then used to grasp the tube approximately 4 cm from the cornual region.  A 3 cm segment of tube was then ligated with a free tie of 0-Chromic using the [place] method and excised.  The right fallopian tube was then ligated in a similar fashion and excised. The tubal lumens were cauterized bilaterally.  Good  hemostasis was noted with bilateral fallopian tubes.  The instruments were then removed from the patient's abdomen and the fascial incision was repaired with 0 Vicryl, and injected with 10 ml of 0.5% Bupivacaine. The subcutaneous fat layer was closed with 3-0 Vicryl. The skin was closed with a 4-0 Vicryl subcuticular stitch and injected with an additional 10 ml of 0.5% Bupivacaine. Dermabond was placed over the incision. The patient tolerated the procedure well.  Instrument, sponge, and needle counts were correct times two.  The patient was then taken to the recovery room awake and in stable condition.   Estimated Blood Loss:  minimal      Drains: none. Patient voided prior to procedure.          Total IV Fluids:  500 ml  Specimens: Segments of bilateral fallopian tubes         Implants: None         Complications:  None; patient tolerated the procedure well.         Disposition: PACU - hemodynamically stable.         Condition: stable   Hildred Laser, MD [facility]

## 2021-04-17 NOTE — Progress Notes (Signed)
RN called to inform me that when pt got up to the bathroom she called out with complaint that tissue was protruding from her vagina. RN stated that she felt like it was tissue probable cervix. She denies excessive bleeding or that the pt was in significant pain. I came to bedside to evaluate pt . On exam no tissue noted at the labia or in the vaginal . Internal exam cervix and uterus appropriately located. Normal bleeding. Pt states explained that she got up to the bathroom to have BM and with bearing down that for BM she felt the vaginal pressure. Discussed ligament and uterus stretching and with bearing down that it caused uterus to drop. Encourage pt to do kegel exercises. To take stool softer to decrease need to bear down excessively , and dicussed using her hand to hold counter pressure. Dr. Valentino Saxon consulted. Pt verbalizes and agrees to plan.   Doreene Burke, CNM

## 2021-04-17 NOTE — Progress Notes (Signed)
Patient called out for assistance and when RN went in the room patient was on the toilet and states "something is out of my vagina". On RN assessment, there was tissue and a small, dark red clot hanging out of the vagina. The patient states she was having a bowel movement and felt the "insides come out". 2 RNs assisted patient back to the bed. Patient states she is not in pain and has no vaginal bleeding, but reports being scared. RN provided reassurance and informed patient she would call her midwife. Janee Morn, CNM notified at 1124 and RN asked CNM to come assess pt. See note from CNM.

## 2021-04-17 NOTE — Anesthesia Preprocedure Evaluation (Signed)
Anesthesia Evaluation  Patient identified by MRN, date of birth, ID band Patient awake    Reviewed: Allergy & Precautions, H&P , NPO status , Patient's Chart, lab work & pertinent test results  History of Anesthesia Complications Negative for: history of anesthetic complications  Airway Mallampati: II  TM Distance: >3 FB     Dental  (+) Teeth Intact   Pulmonary neg pulmonary ROS,    Pulmonary exam normal        Cardiovascular negative cardio ROS Normal cardiovascular exam     Neuro/Psych negative neurological ROS  negative psych ROS   GI/Hepatic negative GI ROS, Neg liver ROS,   Endo/Other  negative endocrine ROS  Renal/GU negative Renal ROS  negative genitourinary   Musculoskeletal negative musculoskeletal ROS (+)   Abdominal Normal abdominal exam  (+)   Peds negative pediatric ROS (+)  Hematology  (+) Blood dyscrasia, anemia ,   Anesthesia Other Findings Past Medical History: No date: Anemia  Reproductive/Obstetrics negative OB ROS                             Anesthesia Physical  Anesthesia Plan  ASA: II  Anesthesia Plan: General   Post-op Pain Management:    Induction: Intravenous  PONV Risk Score and Plan:   Airway Management Planned: Oral ETT  Additional Equipment:   Intra-op Plan:   Post-operative Plan: Extubation in OR  Informed Consent: I have reviewed the patients History and Physical, chart, labs and discussed the procedure including the risks, benefits and alternatives for the proposed anesthesia with the patient or authorized representative who has indicated his/her understanding and acceptance.     Dental advisory given  Plan Discussed with: Anesthesiologist and CRNA  Anesthesia Plan Comments:         Anesthesia Quick Evaluation

## 2021-04-17 NOTE — Anesthesia Procedure Notes (Signed)
Procedure Name: Intubation Date/Time: 04/17/2021 1:15 PM Performed by: Jannet Mantis, CRNA Pre-anesthesia Checklist: Patient identified, Emergency Drugs available, Suction available and Patient being monitored Patient Re-evaluated:Patient Re-evaluated prior to induction Oxygen Delivery Method: Circle system utilized Preoxygenation: Pre-oxygenation with 100% oxygen Induction Type: IV induction Ventilation: Mask ventilation without difficulty Laryngoscope Size: 3 and McGraph Grade View: Grade I Tube type: Oral Tube size: 7.0 mm Number of attempts: 1 Airway Equipment and Method: Stylet and Video-laryngoscopy Placement Confirmation: ETT inserted through vocal cords under direct vision,  positive ETCO2 and breath sounds checked- equal and bilateral Secured at: 21 cm Tube secured with: Tape Dental Injury: Teeth and Oropharynx as per pre-operative assessment

## 2021-04-17 NOTE — Progress Notes (Signed)
RN and CNM spoke to Saltaire MD regarding patient episode in the bathroom (see RN and CNM note). MD states she is aware and is heading to see patient in pre-op now. RN also reported this off to pre-op RN.

## 2021-04-17 NOTE — Progress Notes (Signed)
Progress Note - Vaginal Delivery  Morgan Mann is a 35 y.o. [phone] now PP day 1 s/p Vaginal, Spontaneous .  Having BTL today at 1230 with Dr. Valentino Saxon. Discussed discharge later tonight vs. Tomorrow morning. Pt and partner agree to going home tomorrow.   Subjective:  The patient reports no complaints, up ad lib, voiding, + flatus and NPO for BTL   Objective:  Vital signs in last 24 hours: Temp:  [97.8 F (36.6 C)-98.2 F (36.8 C)] 97.8 F (36.6 C) (05/13 0438) Pulse Rate:  [65-80] 68 (05/13 0438) Resp:  [16-20] 20 (05/13 0438) BP: (109-132)/(60-93) 120/68 (05/13 0438) SpO2:  [94 %-100 %] 99 % (05/13 0438)  Physical Exam:  General: alert, cooperative, appears stated age and no distress Lochia: appropriate Uterine Fundus: firm    Data Review Recent Labs    04/16/21 1222 04/17/21 0658  HGB 12.3 11.9*  HCT 35.6* 35.3*    Assessment/Plan: Active Problems:   Full-term premature rupture of membranes   Vaginal delivery   Plan for discharge tomorrow   -- Continue routine PP care.     Doreene Burke, CNM  04/17/2021 8:46 AM

## 2021-04-17 NOTE — Lactation Note (Signed)
This note was copied from a baby's chart. Lactation Consultation Note  Patient Name: Morgan Mann [id number] Date: 04/17/2021 Reason for consult: Follow-up assessment Age:38 hours  Maternal Data Has patient been taught Hand Expression?: Yes Does the patient have breastfeeding experience prior to this delivery?: Yes How long did the patient breastfeed?: 1 yr Mom had BTL this am Feeding Mother's Current Feeding Choice: Breast Milk and Donor Milk BAby under bili lights for positive Coombs and increasing bili, mom assisted with latching baby to left breast in football hold, baby sucks on tongue and doesn't open mouth wide, needs assist with getting nipple deep in mouth, mom shown how to get deep latch, baby very sleepy at breast and needs much stimulation to suck, FOB gave baby 15cc donor breast milk by bottle after nursing and mom pumped breasts after.  She obtained approx 3 cc colostrom and will give this milk to baby after next breastfeeding, this pumped milk was labeled and mom given additional breast milk labels   LATCH Score Latch: Grasps breast easily, tongue down, lips flanged, rhythmical sucking.  Audible Swallowing: A few with stimulation  Type of Nipple: Everted at rest and after stimulation  Comfort (Breast/Nipple): Soft / non-tender  Hold (Positioning): Assistance needed to correctly position infant at breast and maintain latch.  LATCH Score: 8   Lactation Tools Discussed/Used Tools: Pump Breast pump type: Double-Electric Breast Pump Pump Education: Setup, frequency, and cleaning;Milk Storage Reason for Pumping: to provide supplement for baby Pumping frequency: after every breast feeding Pumped volume: 3 mL  Interventions Interventions: Breast feeding basics reviewed;Assisted with latch;Skin to skin;Breast compression;Adjust position;Hand express;Support pillows;DEBP;Education LC name and no written on white board Discharge Pump: DEBP;Personal  Consult  Status Consult Status: Follow-up Date: 04/18/21 Follow-up type: In-patient    Dyann Kief 04/17/2021, 8:52 PM

## 2021-04-17 NOTE — Progress Notes (Signed)
OBSTETRICS AND GYNECOLOGY PRE-OPERATIVE NOTE   Pre-Op Diagnosis: Multiparity, desiring permanent sterilization  Planned Procedure: Postpartum tubal ligation  Surgeons: Hildred Laser, MD  Anesthesia: General  Blood: Type and screened  Labs:  CBC Latest Ref Rng & Units 04/17/2021 04/16/2021 01/21/2021  WBC 4.0 - 10.5 K/uL 8.8 6.3 4.9  Hemoglobin 12.0 - 15.0 g/dL 11.9(L) 12.3 10.5(L)  Hematocrit 36.0 - 46.0 % 35.3(L) 35.6(L) 30.9(L)  Platelets 150 - 400 K/uL 282 316 310    Lab Results  Component Value Date   ABORH O POS 04/16/2021    Antibiotics: None Needed   Consent: Signed and on chart   Pre-Op BTL Consent Consent: Surgical consent and tubal sterilization. Alternatives to sterilizations are documented on the surgical consent that has been signed by the patient. Patient confirms that she has been previously counseled about permanent sterilization on mutiple occasions during her antepartum course and during this admission. She understands the alternatives to permanents include: oral contraceptive pills, depot provera, patch, ring, intrauterine device (5 year or 10 year), Implanon, condoms and cervical caps/diaphram.  She states that she desires the procedure.  To OR when ready.    Hildred Laser, MD [facility]

## 2021-04-17 NOTE — Anesthesia Postprocedure Evaluation (Signed)
Anesthesia Post Note  Patient: Morgan Mann  Procedure(s) Performed: AN AD HOC LABOR EPIDURAL  Patient location during evaluation: Mother Baby Anesthesia Type: Epidural Level of consciousness: awake and alert Pain management: pain level controlled Vital Signs Assessment: post-procedure vital signs reviewed and stable Respiratory status: spontaneous breathing, nonlabored ventilation and respiratory function stable Cardiovascular status: stable Postop Assessment: no headache, no backache, epidural receding, able to ambulate and no apparent nausea or vomiting Anesthetic complications: no   No complications documented.   Last Vitals:  Vitals:   04/16/21 2356 04/17/21 0438  BP: 109/61 120/68  Pulse: 70 68  Resp: 20 20  Temp: 36.7 C 36.6 C  SpO2: 100% 99%    Last Pain:  Vitals:   04/17/21 0527  TempSrc:   PainSc: 4                  [name] [address]

## 2021-04-18 MED ORDER — IBUPROFEN 600 MG PO TABS: 600.0000 mg | ORAL_TABLET | Freq: Four times a day (QID) | ORAL | 0 refills | Status: AC

## 2021-04-18 MED ORDER — OXYCODONE HCL 5 MG PO TABS: 5.0000 mg | ORAL_TABLET | Freq: Four times a day (QID) | ORAL | Status: DC | PRN

## 2021-04-18 MED ORDER — IBUPROFEN 600 MG PO TABS
600.0000 mg | ORAL_TABLET | Freq: Four times a day (QID) | ORAL | Status: DC
  Administered 2021-04-18: 600 mg via ORAL
  Filled 2021-04-18: qty 1

## 2021-04-18 MED ORDER — DOCUSATE SODIUM 100 MG PO CAPS: 100.0000 mg | ORAL_CAPSULE | Freq: Two times a day (BID) | ORAL | 0 refills | Status: DC

## 2021-04-18 NOTE — Discharge Instructions (Signed)
Breastfeeding Tips for a Good Latch Latching is how your baby's mouth attaches to your nipple to breastfeed. It is an important part of breastfeeding. Your baby may have trouble latching for a number of reasons, such as:  Not being in the right position.  Using a bottle or pacifier too early.  Problems within your baby's mouth, tongue, or lips.  The shape of your nipples.  Your baby being born early (prematurely). Small babies often have a weak suck.  Breasts becoming overfilled with milk (engorged breasts).  Express a little milk to help soften the breast. Work with a breastfeeding specialist (Science writer) to help your baby have a good latch. How does this affect me? A poor latch may cause you to have problems such as:  Cracked nipples.  Sore nipples.  Breasts becoming overfilled with milk  Plugged milk ducts.  Low milk supply.  Breast inflammation.  Breast infection. How does this affect my baby? A poor latch may cause your baby to not be able to feed well. As a result, he or she may have trouble gaining weight. Follow these instructions at home: How to position your baby  Find a comfortable place to sit or lie down. Your neck and back should be well supported.  If you are seated, place a pillow or rolled-up blanket under your baby. This will bring him or her to the level of your breast.  Make sure that your baby's belly is facing your belly.  Try different positions to find one that works best for you and your baby. How to help your baby latch  To start, you might find it helpful to gently rub your breast. Move your fingertips in a circle as you massage from your chest wall toward your nipple. This helps milk flow. Keep doing this during feeding if needed.  Position your breast. Hold your breast with four fingers underneath and your thumb above your nipple. Keep your fingers away from your nipple and your baby's mouth. Follow these steps to help your baby  latch: 1. Rub your baby's lips gently with your finger or nipple. 2. When your baby's mouth is open wide enough, quickly bring your baby to your breast and place your whole nipple into your baby's mouth. Place as much of the colored area around your nipple (areola)as possible into your baby's mouth. 3. Your baby's tongue should be between his or her lower gum and your breast. 4. You should be able to see more areola above your baby's upper lip than below the lower lip. 5. When your baby starts sucking, you will feel a gentle pull on your nipple. You should not feel any pain. Be patient. It is common for a baby to suck for about 2-3 minutes to start the flow of breast milk. 6. Make sure that your baby's mouth is in the right position around your nipple. Your baby's lips should make a seal on your breast and be turned outward.   General instructions  Look for these signs that your baby has latched on to your nipple: ? The baby is quietly tugging or sucking without causing you pain. ? You hear the baby swallow after every 3 or 4 sucks. ? You see movement above and in front of the baby's ears while he or she is sucking.  Be aware of these signs that your baby has not latched on to your nipple: ? The baby makes sucking sounds or smacking sounds while feeding. ? You have nipple pain.  If your baby is not latched well, put your little finger between your baby's gums and your nipple. This will break the seal. Then try to help your baby latch again.  If you need help, get help from a breastfeeding specialist. Contact a doctor if:  You have cracking or soreness in your nipples that lasts longer than 1 week.  You have nipple pain.  Your breasts are filled with too much milk (engorgement), and this does not improve after 48-72 hours.  You have a plugged milk duct and a fever.  You follow the tips for a good latch but need more help.  You have a pus-like fluid coming from your breast.  Your  baby is not gaining weight.  Your baby loses weight. Summary  Latching is how your baby's mouth attaches to your nipple to breastfeed.  Try different positions for breastfeeding to find one that works best for you and your baby.  A poor latch may cause you to have cracked or sore nipples or other problems.  Work with a breastfeeding specialist (Advertising copywriter) to help your baby have a good latch. This information is not intended to replace advice given to you by your health care provider. Make sure you discuss any questions you have with your health care provider. Document Revised: 05/21/2020 Document Reviewed: 05/21/2020 Elsevier Patient Education  2021 [employer] Postpartum Care After Vaginal Delivery The following information offers guidance about how to care for yourself from the time you deliver your baby to 6-12 weeks after delivery (postpartum period). If you have problems or questions, contact your health care provider for more specific instructions. Follow these instructions at home: Vaginal bleeding  It is normal to have vaginal bleeding (lochia) after delivery. Wear a sanitary pad for bleeding and discharge. ? During the first week after delivery, the amount and appearance of lochia is often similar to a menstrual period. ? Over the next few weeks, it will gradually decrease to a dry, yellow-brown discharge. ? For most women, lochia stops completely by 4-6 weeks after delivery, but can vary.  Change your sanitary pads frequently. Watch for any changes in your flow, such as: ? A sudden increase in volume. ? A change in color. ? Large blood clots.  If you pass a blood clot from your vagina, save it and call your health care provider. Do not flush blood clots down the toilet before talking with your health care provider.  Do not use tampons or douches until your health care provider approves.  If you are not breastfeeding, your period should return 6-8 weeks after  delivery. If you are feeding your baby breast milk only, your period may not return until you stop breastfeeding. Perineal care  Keep the area between the vagina and the anus (perineum) clean and dry. Use medicated pads and pain-relieving sprays and creams as directed.  If you had a surgical cut in the perineum (episiotomy) or a tear, check the area for signs of infection until you are healed. Check for: ? More redness, swelling, or pain. ? Fluid or blood coming from the cut or tear. ? Warmth. ? Pus or a bad smell.  You may be given a squirt bottle to use instead of wiping to clean the perineum area after you use the bathroom. Pat the area gently to dry it.  To relieve pain caused by an episiotomy, a tear, or swollen veins in the anus (hemorrhoids), take a warm sitz bath 2-3 times a day. In a  sitz bath, the warm water should only come up to your hips and cover your buttocks.   Breast care  In the first few days after delivery, your breasts may feel heavy, full, and uncomfortable (breast engorgement). Milk may also leak from your breasts. Ask your health care provider about ways to help relieve the discomfort.  If you are breastfeeding: ? Wear a bra that supports your breasts and fits well. Use breast pads to absorb milk that leaks. ? Keep your nipples clean and dry. Apply creams and ointments as told. ? You may have uterine contractions every time you breastfeed for up to several weeks after delivery. This helps your uterus return to its normal size. ? If you have any problems with breastfeeding, notify your health care provider or lactation consultant.  If you are not breastfeeding: ? Avoid touching your breasts. Do not squeeze out (express) milk. Doing this can make your breasts produce more milk. ? Wear a good-fitting bra and use cold packs to help with swelling. Intimacy and sexuality  Ask your health care provider when you can engage in sexual activity. This may depend upon: ? Your  risk of infection. ? How fast you are healing. ? Your comfort and desire to engage in sexual activity.  You are able to get pregnant after delivery, even if you have not had your period. Talk with your health care provider about methods of birth control (contraception) or family planning if you desire future pregnancies. Medicines  Take over-the-counter and prescription medicines only as told by your health care provider.  Take an over-the-counter stool softener to help ease bowel movements as told by your health care provider.  If you were prescribed an antibiotic medicine, take it as told by your health care provider. Do not stop taking the antibiotic even if you start to feel better.  Review all previous and current prescriptions to check for possible transfer into breast milk. Activity  Gradually return to your normal activities as told by your health care provider.  Rest as much as possible. Nap while your baby is sleeping. Eating and drinking  Drink enough fluid to keep your urine pale yellow.  To help prevent or relieve constipation, eat high-fiber foods every day.  Choose healthy eating to support breastfeeding or weight loss goals.  Take your prenatal vitamins until your health care provider tells you to stop.   General tips/recommendations  Do not use any products that contain nicotine or tobacco. These products include cigarettes, chewing tobacco, and vaping devices, such as e-cigarettes. If you need help quitting, ask your health care provider.  Do not drink alcohol, especially if you are breastfeeding.  Do not take medications or drugs that are not prescribed to you, especially if you are breastfeeding.  Visit your health care provider for a postpartum checkup within the first 3-6 weeks after delivery.  Complete a comprehensive postpartum visit no later than 12 weeks after delivery.  Keep all follow-up visits for you and your baby. Contact a health care provider  if:  You feel unusually sad or worried.  Your breasts become red, painful, or hard.  You have a fever or other signs of an infection.  You have bleeding that is soaking through one pad an hour or you have blood clots.  You have a severe headache that doesn't go away or you have vision changes.  You have nausea and vomiting and are unable to eat or drink anything for 24 hours. Get help right away  if:  You have chest pain or difficulty breathing.  You have sudden, severe leg pain.  You faint or have a seizure.  You have thoughts about hurting yourself or your baby. If you ever feel like you may hurt yourself or others, or have thoughts about taking your own life, get help right away. Go to your nearest emergency department or:  Call your local emergency services (911 in the [place]  The National Suicide Prevention Lifeline at 204-578-4764. This suicide crisis helpline is open 24 hours a day.  Text the Crisis Text Line at 9026716788 (in the [place] Summary  The period of time after you deliver your newborn up to 6-12 weeks after delivery is called the postpartum period.  Keep all follow-up visits for you and your baby.  Review all previous and current prescriptions to check for possible transfer into breast milk.  Contact a health care provider if you feel unusually sad or worried during the postpartum period. This information is not intended to replace advice given to you by your health care provider. Make sure you discuss any questions you have with your health care provider. Document Revised: 08/07/2020 Document Reviewed: 08/07/2020 Elsevier Patient Education  2021 Elsevier Inc. Laparoscopic Tubal Ligation, Care After The following information offers guidance on how to care for yourself after your procedure. Your health care provider may also give you more specific instructions. If you have problems or questions, contact your health care provider. What can I expect after the  procedure? After the procedure, it is common to have:  A sore throat if general anesthesia was used.  Pain in shoulders, back, and abdomen. This is caused by the gas that was used during the procedure.  Mild discomfort or cramping in your abdomen.  Pain or soreness in the area where the surgical incision was made.  A bloated feeling.  Tiredness.  Nausea and vomiting. Follow these instructions at home: Medicines  Take over-the-counter and prescription medicines only as told by your health care provider.  Ask your health care provider if the medicine prescribed to you: ? Requires you to avoid driving or using heavy machinery. ? Can cause constipation. You may need to take these actions to prevent or treat constipation:  Drink enough fluid to keep your urine pale yellow.  Take over-the-counter or prescription medicines.  Eat foods that are high in fiber, such as beans, whole grains, and fresh fruits and vegetables.  Limit foods that are high in fat and processed sugars, such as fried or sweet foods.  Do not take aspirin because it can cause bleeding. Incision care  Follow instructions from your health care provider about how to take care of your incision. Make sure you: ? Wash your hands with soap and water for at least 20 seconds before and after you change your bandage (dressing). If soap and water are not available, use hand sanitizer. ? Change your dressing as told by your health care provider. ? Leave stitches (sutures), skin glue, or adhesive strips in place. These skin closures may need to stay in place for 2 weeks or longer. If adhesive strip edges start to loosen and curl up, you may trim the loose edges. Do not remove adhesive strips completely unless your health care provider tells you to do that.  Check your incision area every day for signs of infection. Check for: ? Redness, swelling, or more pain. ? Fluid or blood. ? Warmth. ? Pus or a bad smell.  Activity  Rest as told by your health care provider.  Avoid sitting for a long time without moving. Get up to take short walks every 1-2 hours. This is important to improve blood flow and breathing. Ask for help if you feel weak or unsteady.  Do not have sex, douche, or put a tampon or anything else in your vagina for 6 weeks or as long as told by your health care provider.  Do not lift anything that is heavier than your baby for 2 weeks, or the limit that you are told, until your health care provider says that it is safe.  Do not take baths, swim, or use a hot tub until your health care provider approves. Ask your health care provider if you may take showers. You may only be allowed to take sponge baths.  Return to your normal activities as told by your health care provider. Ask your health care provider what activities are safe for you. General instructions  After the procedure you may need to wear a sanitary pad for vaginal discharge.  Have someone help you with your daily household tasks for the first few days.  Keep all follow-up visits. This is important. Contact a health care provider if:  You have redness, swelling, or more pain around your incision.  Your incision feels warm to the touch.  You have pus or a bad smell coming from your incision.  The edges of your incision break open after the sutures have been removed.  Your pain does not improve after 2-3 days.  You have a rash.  You repeatedly become dizzy or light-headed.  Your pain medicine is not helping. Get help right away if:  You have a fever or chills.  You faint.  You have increasing pain in your abdomen.  You have severe pain in one or both of your shoulders.  You have fluid or blood coming from your sutures or heavy bleeding from your vagina.  You have shortness of breath or difficulty breathing.  You have chest pain, leg pain, or leg swelling.  You have ongoing nausea, vomiting, or  diarrhea. These symptoms may represent a serious problem that is an emergency. Do not wait to see if the symptoms will go away. Get medical help right away. Call your local emergency services (911 in the [place] Do not drive yourself to the hospital. Summary  After the procedure, it is common to have mild discomfort or cramping in your abdomen.  After the procedure you may need to wear a sanitary pad for vaginal discharge.  Take over-the-counter and prescription medicines only as told by your health care provider.  Watch for symptoms that should prompt you to call your health care provider.  Keep all follow-up visits. This is important. This information is not intended to replace advice given to you by your health care provider. Make sure you discuss any questions you have with your health care provider. Document Revised: 08/08/2020 Document Reviewed: 08/08/2020 Elsevier Patient Education  2021 [employer].

## 2021-04-18 NOTE — Progress Notes (Signed)
Pt discharged with infant.  Discharge instructions, prescriptions and follow up appointment given to and reviewed with pt. Pt verbalized understanding. Escorted out by auxillary. 

## 2021-04-18 NOTE — Final Progress Note (Signed)
Discharge Day SOAP Note:  Progress Note - Vaginal Delivery  Morgan Mann is a 35 y.o. [phone] now PP day 2 s/p Vaginal, Spontaneous . Delivery was uncomplicated  Subjective  The patient has the following complaints: has no unusual complaints  Pain is controlled with current medications.   Patient is urinating without difficulty.  She is ambulating well.     Objective  Vital signs: BP (!) 112/50 (BP Location: Left Arm)   Pulse 72   Temp 98.6 F (37 C) (Oral)   Resp 18   Ht 5\' 8"  (1.727 m)   Wt 113.4 kg   LMP 07/08/2020 (Exact Date)   SpO2 99%   Breastfeeding Unknown   BMI 38.01 kg/m   Physical Exam: Gen: NAD Fundus Fundal Tone: Firm  Lochia Amount: Scant        Data Review Labs: Lab Results  Component Value Date   WBC 8.8 04/17/2021   HGB 11.9 (L) 04/17/2021   HCT 35.3 (L) 04/17/2021   MCV 91.9 04/17/2021   PLT 282 04/17/2021   CBC Latest Ref Rng & Units 04/17/2021 04/16/2021 01/21/2021  WBC 4.0 - 10.5 K/uL 8.8 6.3 4.9  Hemoglobin 12.0 - 15.0 g/dL 11.9(L) 12.3 10.5(L)  Hematocrit 36.0 - 46.0 % 35.3(L) 35.6(L) 30.9(L)  Platelets 150 - 400 K/uL 282 316 310   O POS  Edinburgh Score: Edinburgh Postnatal Depression Scale Screening Tool 04/18/2021  I have been able to laugh and see the funny side of things. 0  I have looked forward with enjoyment to things. 0  I have blamed myself unnecessarily when things went wrong. 0  I have been anxious or worried for no good reason. 2  I have felt scared or panicky for no good reason. 0  Things have been getting on top of me. 1  I have been so unhappy that I have had difficulty sleeping. 0  I have felt sad or miserable. 0  I have been so unhappy that I have been crying. 0  The thought of harming myself has occurred to me. 0  Edinburgh Postnatal Depression Scale Total 3    Assessment/Plan  Active Problems:   Full-term premature rupture of membranes   Vaginal delivery    Plan for discharge today.  Discharge  Instructions: Per After Visit Summary. Activity: Advance as tolerated. Pelvic rest for 6 weeks.  Also refer to After Visit Summary Diet: Regular Medications: Allergies as of 04/18/2021   No Known Allergies     Medication List    STOP taking these medications   Doxylamine-Pyridoxine 10-10 MG Tbec Commonly known as: Diclegis     TAKE these medications   acetaminophen 500 MG tablet Commonly known as: TYLENOL Take 500 mg by mouth every 6 (six) hours as needed.   docusate sodium 100 MG capsule Commonly known as: Colace Take 1 capsule (100 mg total) by mouth 2 (two) times daily.   Fusion Plus Caps Take 1 tablet by mouth daily.   ibuprofen 600 MG tablet Commonly known as: ADVIL Take 1 tablet (600 mg total) by mouth every 6 (six) hours.   prenatal multivitamin Tabs tablet Take 1 tablet by mouth daily at 12 noon.      Outpatient follow up: 2 week tele visit, 6 week postpartum visit with 04/20/2021  Postpartum contraception: Postpartum tubal done  Discharged Condition: good  Discharged to: home  Newborn Data: Disposition:home with mother  Apgars: APGAR (1 MIN): 8   APGAR (5 MINS): 9   APGAR (10  MINS):    Baby Feeding: Breast  Doreene Burke, CNM  04/18/2021 8:41 AM

## 2021-04-18 NOTE — Discharge Summary (Signed)
Patient Name: Morgan Mann DOB: [birth year] MRN: [id number]                            Discharge Summary  Date of Admission: 04/16/2021 Date of Discharge: 04/18/2021 Delivering Provider: Holly Bodily MICHELLE   Admitting Diagnosis: Full-term premature rupture of membranes [O42.92] at [redacted]w[redacted]d Secondary diagnosis:  Active Problems:   Full-term premature rupture of membranes   Vaginal delivery   Mode of Delivery: normal spontaneous vaginal delivery and tubal ligation was performed              Discharge diagnosis: Term Pregnancy Delivered      Intrapartum Procedures: epidural and tubal ligation   Post partum procedures: postpartum tubal ligation  Complications: none                     Discharge Day SOAP Note:  Progress Note - Vaginal Delivery  Morgan Mann is a 35 y.o. [id number] now PP day 2 s/p Vaginal, Spontaneous . Delivery was uncomplicated  Subjective  The patient has the following complaints: has no unusual complaints  Pain is controlled with current medications.   Patient is urinating without difficulty.  She is ambulating well.     Objective  Vital signs: BP (!) 112/50 (BP Location: Left Arm)   Pulse 72   Temp 98.6 F (37 C) (Oral)   Resp 18   Ht 5\' 8"  (1.727 m)   Wt 113.4 kg   LMP 07/08/2020 (Exact Date)   SpO2 99%   Breastfeeding Unknown   BMI 38.01 kg/m   Physical Exam: Gen: NAD Fundus Fundal Tone: Firm  Lochia Amount: Scant        Data Review Labs: Lab Results  Component Value Date   WBC 8.8 04/17/2021   HGB 11.9 (L) 04/17/2021   HCT 35.3 (L) 04/17/2021   MCV 91.9 04/17/2021   PLT 282 04/17/2021   CBC Latest Ref Rng & Units 04/17/2021 04/16/2021 01/21/2021  WBC 4.0 - 10.5 K/uL 8.8 6.3 4.9  Hemoglobin 12.0 - 15.0 g/dL 11.9(L) 12.3 10.5(L)  Hematocrit 36.0 - 46.0 % 35.3(L) 35.6(L) 30.9(L)  Platelets 150 - 400 K/uL 282 316 310   O POS  Edinburgh Score: Edinburgh Postnatal Depression Scale Screening Tool 04/18/2021  I have been  able to laugh and see the funny side of things. 0  I have looked forward with enjoyment to things. 0  I have blamed myself unnecessarily when things went wrong. 0  I have been anxious or worried for no good reason. 2  I have felt scared or panicky for no good reason. 0  Things have been getting on top of me. 1  I have been so unhappy that I have had difficulty sleeping. 0  I have felt sad or miserable. 0  I have been so unhappy that I have been crying. 0  The thought of harming myself has occurred to me. 0  Edinburgh Postnatal Depression Scale Total 3    Assessment/Plan  Active Problems:   Full-term premature rupture of membranes   Vaginal delivery    Plan for discharge today.  Discharge Instructions: Per After Visit Summary. Activity: Advance as tolerated. Pelvic rest for 6 weeks.  Also refer to After Visit Summary Diet: Regular Medications: Allergies as of 04/18/2021   No Known Allergies     Medication List    STOP taking these medications   Doxylamine-Pyridoxine 10-10 MG  Tbec Commonly known as: Diclegis     TAKE these medications   acetaminophen 500 MG tablet Commonly known as: TYLENOL Take 500 mg by mouth every 6 (six) hours as needed.   docusate sodium 100 MG capsule Commonly known as: Colace Take 1 capsule (100 mg total) by mouth 2 (two) times daily.   Fusion Plus Caps Take 1 tablet by mouth daily.   ibuprofen 600 MG tablet Commonly known as: ADVIL Take 1 tablet (600 mg total) by mouth every 6 (six) hours.   prenatal multivitamin Tabs tablet Take 1 tablet by mouth daily at 12 noon.      Outpatient follow up: 2 week tele visit, 6 week postpartum visit with Marcelino Duster  Postpartum contraception: Postpartum tubal done  Discharged Condition: good  Discharged to: home  Newborn Data: Disposition:home with mother  Apgars: APGAR (1 MIN): 8   APGAR (5 MINS): 9   APGAR (10 MINS):    Baby Feeding: Breast  Doreene Burke, CNM  04/18/2021 8:41 AM

## 2021-04-20 ENCOUNTER — Encounter: Payer: Self-pay | Admitting: Obstetrics and Gynecology

## 2021-04-20 NOTE — Anesthesia Postprocedure Evaluation (Signed)
Anesthesia Post Note  Patient: Morgan Mann  Procedure(s) Performed: POST PARTUM TUBAL LIGATION (Bilateral )  Patient location during evaluation: PACU Anesthesia Type: General Level of consciousness: awake and alert and oriented Pain management: pain level controlled Vital Signs Assessment: post-procedure vital signs reviewed and stable Respiratory status: spontaneous breathing Cardiovascular status: blood pressure returned to baseline Anesthetic complications: no   No complications documented.   Last Vitals:  Vitals:   04/17/21 2335 04/18/21 0802  BP: (!) 114/56 (!) 112/50  Pulse: 68 72  Resp: 20 18  Temp: 37.2 C 37 C  SpO2: 99% 99%    Last Pain:  Vitals:   04/18/21 0830  TempSrc:   PainSc: 0-No pain                 Chiyo Fay

## 2021-04-21 LAB — SURGICAL PATHOLOGY

## 2021-04-29 ENCOUNTER — Other Ambulatory Visit: Payer: Self-pay

## 2021-04-29 ENCOUNTER — Encounter: Payer: Self-pay | Admitting: Certified Nurse Midwife

## 2021-04-29 ENCOUNTER — Ambulatory Visit (INDEPENDENT_AMBULATORY_CARE_PROVIDER_SITE_OTHER): Payer: Medicaid Other | Admitting: Certified Nurse Midwife

## 2021-04-29 DIAGNOSIS — Z1331 Encounter for screening for depression: Secondary | ICD-10-CM

## 2021-04-29 NOTE — Patient Instructions (Signed)
Postpartum Baby Blues The postpartum period begins right after the birth of a baby. During this time, there is often joy and excitement. It is also a time of many changes in the life of the parents. A mother may feel happy one minute and sad or stressed the next. These feelings of sadness, called the baby blues, usually happen in the period right after the baby is born and go away within a week or two. What are the causes? The exact cause of this condition is not known. Changes in hormone levels after childbirth are believed to trigger some of the symptoms. Other factors that can play a role in these mood changes include:  Lack of sleep.  Stressful life events, such as financial problems, caring for a loved one, or death of a loved one.  Genetics. What are the signs or symptoms? Symptoms of this condition include:  Changes in mood, such as going from extreme happiness to sadness.  A decrease in concentration.  Difficulty sleeping.  Crying spells and tearfulness.  Loss of appetite.  Irritability.  Anxiety. If these symptoms last for more than 2 weeks or become more severe, you may have postpartum depression. How is this diagnosed? This condition is diagnosed based on an evaluation of your symptoms. Your health care provider may use a screening tool that includes a list of questions to help identify a person with the baby blues or postpartum depression. How is this treated? The baby blues usually go away on their own in 1-2 weeks. Social support is often what is needed. You will be encouraged to get adequate sleep and rest. Follow these instructions at home: Lifestyle  Get as much rest as you can. Take a nap when the baby sleeps.  Exercise regularly as told by your health care provider. Some women find yoga and walking to be helpful.  Eat a balanced and nourishing diet. This includes plenty of fruits and vegetables, whole grains, and lean proteins.  Do little things that you  enjoy. Take a bubble bath, read your favorite magazine, or listen to your favorite music.  Avoid alcohol.  Ask for help with household chores, cooking, grocery shopping, or running errands. Do not try to do everything yourself. Consider hiring a postpartum doula to help. This is a professional who specializes in providing support to new mothers.  Try not to make any major life changes during pregnancy or right after giving birth. This can add stress.      General instructions  Talk to people close to you about how you are feeling. Get support from your partner, family members, friends, or other new moms. You may want to join a support group.  Find ways to manage stress. This may include: ? Writing your thoughts and feelings in a journal. ? Spending time outside. ? Spending time with people who make you laugh.  Try to stay positive in how you think. Think about the things you are grateful for.  Take over-the-counter and prescription medicines only as told by your health care provider.  Let your health care provider know if you have any concerns.  Keep all postpartum visits. This is important. Contact a health care provider if:  Your baby blues do not go away after 2 weeks. Get help right away if:  You have thoughts of taking your own life (suicidal thoughts), or of harming your baby or someone else.  You see or hear things that are not there (hallucinations). If you ever feel like you   may hurt yourself or others, or have thoughts about taking your own life, get help right away. Go to your nearest emergency department or:  Call your local emergency services (911 in the [place]  Call a suicide crisis helpline, such as the National Suicide Prevention Lifeline, at 1-800-273-8255. This is open 24 hours a day in the [place]  Text the Crisis Text Line at 741741 (in the [place] Summary  After giving birth, you may feel happy one minute and sad or stressed the next. Feelings of sadness  that happen right after the baby is born and go away after a week or two are called the baby blues.  You can manage the baby blues by getting enough rest, eating a healthy diet, exercising, spending time with supportive people, and finding ways to manage stress.  If feelings of sadness and stress last longer than 2 weeks or get in the way of caring for your baby, talk with your health care provider. This may mean you have postpartum depression. This information is not intended to replace advice given to you by your health care provider. Make sure you discuss any questions you have with your health care provider. Document Revised: 05/16/2020 Document Reviewed: 05/16/2020 Elsevier Patient Education  2021 [employer]  

## 2021-04-29 NOTE — Progress Notes (Signed)
Virtual Visit via Telephone Note  I connected with Morgan Mann on 04/29/21 at 11:30 AM EDT by telephone and verified that I am speaking with the correct person using two identifiers.  Location: Patient: at home Provider: at the office    I discussed the limitations, risks, security and privacy concerns of performing an evaluation and management service by telephone and the availability of in person appointments. I also discussed with the patient that there may be a patient responsible charge related to this service. The patient expressed understanding and agreed to proceed.   History of Present Illness: SVD and BTL 40wks 2 days. Follow up for mood screening   Observations/Objective:Pt states she is doing well, having minimal bleeding and is using motrin occasional for pain. She denies any issues with her incision site. She is breastfeeding the baby and that is going well. She states the baby gets up 2 time a night to eat. She denies any concerns about her mood.   GAD 7 : Generalized Anxiety Score 04/29/2021  Nervous, Anxious, on Edge 0  Control/stop worrying 0  Worry too much - different things 0  Trouble relaxing 0  Restless 0  Easily annoyed or irritable 2  Afraid - awful might happen 0  Total GAD 7 Score 2  Anxiety Difficulty Not difficult at all   Flowsheet Row Office Visit from 04/29/2021 in [facility]  PHQ-9 Total Score 2       Assessment and Plan: Doing well post delivery  Follow Up Instructions: As scheduled 4 wks for postpartum office visit.    I discussed the assessment and treatment plan with the patient. The patient was provided an opportunity to ask questions and all were answered. The patient agreed with the plan and demonstrated an understanding of the instructions.   The patient was advised to call back or seek an in-person evaluation if the symptoms worsen or if the condition fails to improve as anticipated.  I provided 7 minutes of  non-face-to-face time during this encounter.   Doreene Burke, CNM

## 2021-05-01 ENCOUNTER — Telehealth: Payer: Self-pay

## 2021-05-01 NOTE — Telephone Encounter (Signed)
Husband's FMLA paperwork ready for pickup. Patient called. Patient aware. Will come by next week.

## 2021-05-27 ENCOUNTER — Ambulatory Visit (INDEPENDENT_AMBULATORY_CARE_PROVIDER_SITE_OTHER): Payer: Medicaid Other | Admitting: Certified Nurse Midwife

## 2021-05-27 ENCOUNTER — Encounter: Payer: Self-pay | Admitting: Certified Nurse Midwife

## 2021-05-27 ENCOUNTER — Other Ambulatory Visit: Payer: Self-pay

## 2021-05-27 NOTE — Patient Instructions (Signed)
Preventive Care 21-37 Years Old, Female Preventive care refers to lifestyle choices and visits with your health care provider that can promote health and wellness. This includes: A yearly physical exam. This is also called an annual wellness visit. Regular dental and eye exams. Immunizations. Screening for certain conditions. Healthy lifestyle choices, such as: Eating a healthy diet. Getting regular exercise. Not using drugs or products that contain nicotine and tobacco. Limiting alcohol use. What can I expect for my preventive care visit? Physical exam Your health care provider may check your: Height and weight. These may be used to calculate your BMI (body mass index). BMI is a measurement that tells if you are at a healthy weight. Heart rate and blood pressure. Body temperature. Skin for abnormal spots. Counseling Your health care provider may ask you questions about your: Past medical problems. Family's medical history. Alcohol, tobacco, and drug use. Emotional well-being. Home life and relationship well-being. Sexual activity. Diet, exercise, and sleep habits. Work and work environment. Access to firearms. Method of birth control. Menstrual cycle. Pregnancy history. What immunizations do I need?  Vaccines are usually given at various ages, according to a schedule. Your health care provider will recommend vaccines for you based on your age, medicalhistory, and lifestyle or other factors, such as travel or where you work. What tests do I need?  Blood tests Lipid and cholesterol levels. These may be checked every 5 years starting at age 20. Hepatitis C test. Hepatitis B test. Screening Diabetes screening. This is done by checking your blood sugar (glucose) after you have not eaten for a while (fasting). STD (sexually transmitted disease) testing, if you are at risk. BRCA-related cancer screening. This may be done if you have a family history of breast, ovarian, tubal, or  peritoneal cancers. Pelvic exam and Pap test. This may be done every 3 years starting at age 21. Starting at age 30, this may be done every 5 years if you have a Pap test in combination with an HPV test. Talk with your health care provider about your test results, treatment options,and if necessary, the need for more tests. Follow these instructions at home: Eating and drinking  Eat a healthy diet that includes fresh fruits and vegetables, whole grains, lean protein, and low-fat dairy products. Take vitamin and mineral supplements as recommended by your health care provider. Do not drink alcohol if: Your health care provider tells you not to drink. You are pregnant, may be pregnant, or are planning to become pregnant. If you drink alcohol: Limit how much you have to 0-1 drink a day. Be aware of how much alcohol is in your drink. In the [place] one drink equals one 12 oz bottle of beer (355 mL), one 5 oz glass of wine (148 mL), or one 1 oz glass of hard liquor (44 mL).  Lifestyle Take daily care of your teeth and gums. Brush your teeth every morning and night with fluoride toothpaste. Floss one time each day. Stay active. Exercise for at least 30 minutes 5 or more days each week. Do not use any products that contain nicotine or tobacco, such as cigarettes, e-cigarettes, and chewing tobacco. If you need help quitting, ask your health care provider. Do not use drugs. If you are sexually active, practice safe sex. Use a condom or other form of protection to prevent STIs (sexually transmitted infections). If you do not wish to become pregnant, use a form of birth control. If you plan to become pregnant, see your health care   provider for a prepregnancy visit. Find healthy ways to cope with stress, such as: Meditation, yoga, or listening to music. Journaling. Talking to a trusted person. Spending time with friends and family. Safety Always wear your seat belt while driving or riding in a  vehicle. Do not drive: If you have been drinking alcohol. Do not ride with someone who has been drinking. When you are tired or distracted. While texting. Wear a helmet and other protective equipment during sports activities. If you have firearms in your house, make sure you follow all gun safety procedures. Seek help if you have been physically or sexually abused. What's next? Go to your health care provider once a year for an annual wellness visit. Ask your health care provider how often you should have your eyes and teeth checked. Stay up to date on all vaccines. This information is not intended to replace advice given to you by your health care provider. Make sure you discuss any questions you have with your healthcare provider. Document Revised: 07/20/2020 Document Reviewed: 08/03/2018 Elsevier Patient Education  2022 [employer].

## 2021-05-27 NOTE — Progress Notes (Signed)
Subjective:    Morgan Mann is a 35 y.o. [phone] African American female who presents for a postpartum visit. She is 6 weeks postpartum following a spontaneous vaginal delivery and PP BTL  at term gestational weeks. Anesthesia: epidural. I have fully reviewed the prenatal and intrapartum course. Postpartum course has been normal. Baby's course has been normal. Baby is feeding by breast. Bleeding no bleeding. Bowel function is normal. Bladder function is normal. Patient is not sexually active. Last sexual activity: prior to deliver. Contraception method is tubal ligation. Postpartum depression screening: negative. Score 0.  Last pap 3/25/202 and was negative/neagtive.  The following portions of the patient's history were reviewed and updated as appropriate: allergies, current medications, past medical history, past surgical history and problem list.  Review of Systems Pertinent items are noted in HPI.   Vitals:   05/27/21 1110  BP: 120/85  Pulse: 61  Weight: 238 lb 14.4 oz (108.4 kg)  Height: 5\' 8"  (1.727 m)   No LMP recorded.  Objective:   General:  alert, cooperative and no distress   Breasts:  deferred, no complaints  Lungs: clear to auscultation bilaterally  Heart:  regular rate and rhythm  Abdomen: soft, nontender   Vulva: normal  Vagina: normal vagina  Cervix:  closed  Corpus: Well-involuted  Adnexa:  Non-palpable  Rectal Exam: none hemorrhoids        Assessment:   Postpartum exam 6 wks s/p SVD/BTL Breastfeeding Depression screening Contraception counseling   Plan:  : tubal ligation Follow up in: 6 months for annual or earlier if needed  , CNM

## 2021-06-01 ENCOUNTER — Telehealth: Payer: Self-pay

## 2021-06-02 NOTE — Telephone Encounter (Signed)
Spoke with pt letting her know her Return to Work note has been faxed to her employer. She voiced no further questions.

## 2021-12-21 ENCOUNTER — Encounter: Payer: Medicaid Other | Admitting: Certified Nurse Midwife

## 2022-04-28 ENCOUNTER — Ambulatory Visit
Admission: RE | Admit: 2022-04-28 | Discharge: 2022-04-28 | Disposition: A | Discharge status: Home / Self Care | Payer: Medicaid Other | Source: Ambulatory Visit | Attending: Emergency Medicine | Admitting: Emergency Medicine

## 2022-04-28 VITALS — BP 120/83 | HR 63 | Temp 98.2°F | Resp 20

## 2022-04-28 DIAGNOSIS — H6120 Impacted cerumen, unspecified ear: Secondary | ICD-10-CM | POA: Diagnosis not present

## 2022-04-28 NOTE — ED Triage Notes (Signed)
Pt here with right ear irritation since yesterday.

## 2022-04-28 NOTE — ED Provider Notes (Signed)
[facility]    CSN: [id number] Arrival date & time: 04/28/22  1629    HISTORY   Chief Complaint  Patient presents with   Ear Problem   HPI Morgan Mann is a 36 y.o. female. Patient presents to urgent care complaining of something being in her right ear.  Patient states she can hear it moving.  Patient states she is concerned it is an insect.  Patient states she is unaware of any insect flying into her ears.  Patient states she has never had a similar experience in the past.  Patient denies history of ear infections.  Patient denies history of allergies.  Patient denies history of cerumen impaction.  The history is provided by the patient.  Past Medical History:  Diagnosis Date   Anemia    Patient Active Problem List   Diagnosis Date Noted   Full-term premature rupture of membranes 04/16/2021   Vaginal delivery    Indication for care in labor and delivery, antepartum 04/03/2021   Indication for care in labor or delivery    Pregnancy 03/05/2021   History of premature rupture of membranes 07/14/2019   Past Surgical History:  Procedure Laterality Date   EYE SURGERY     cosmetic surgery   TUBAL LIGATION Bilateral 04/17/2021   Procedure: POST PARTUM TUBAL LIGATION;  Surgeon: Hildred Laserherry, Anika, MD;  Location: [facility] ORS;  Service: Gynecology;  Laterality: Bilateral;   OB History     Gravida  6   Para  3   Term  3   Preterm      AB  3   Living  3      SAB      IAB  3   Ectopic      Multiple  0   Live Births  3          Home Medications    Prior to Admission medications   Medication Sig Start Date End Date Taking? Authorizing Provider  ibuprofen (ADVIL) 600 MG tablet Take 1 tablet (600 mg total) by mouth every 6 (six) hours. 04/18/21   Doreene Burkehompson, Annie, CNM  Prenatal Vit-Fe Fumarate-FA (PRENATAL MULTIVITAMIN) TABS tablet Take 1 tablet by mouth daily at 12 noon.    [provider]    Family History Family History  Problem Relation Age of  Onset   Ovarian cancer Mother    Healthy Father    Breast cancer Neg Hx    Colon cancer Neg Hx    Social History Social History   Tobacco Use   Smoking status: Never   Smokeless tobacco: Never  Vaping Use   Vaping Use: Never used  Substance Use Topics   Alcohol use: No   Drug use: Never   Allergies   Patient has no known allergies.  Review of Systems Review of Systems Pertinent findings noted in history of present illness.   Physical Exam Triage Vital Signs ED Triage Vitals  Enc Vitals Group     BP 10/02/21 0827 (!) 147/82     Pulse Rate 10/02/21 0827 72     Resp 10/02/21 0827 18     Temp 10/02/21 0827 98.3 F (36.8 C)     Temp Source 10/02/21 0827 Oral     SpO2 10/02/21 0827 98 %     Weight --      Height --      Head Circumference --      Peak Flow --      Pain Score 10/02/21  2423 5     Pain Loc --      Pain Edu? --      Excl. in GC? --   No data found.  Updated Vital Signs BP 120/83   Pulse 63   Temp 98.2 F (36.8 C)   Resp 20   SpO2 99%   Breastfeeding Yes   Physical Exam Vitals and nursing note reviewed.  Constitutional:      General: She is not in acute distress.    Appearance: Normal appearance. She is not ill-appearing.  HENT:     Head: Normocephalic and atraumatic.     Salivary Glands: Right salivary gland is not diffusely enlarged or tender. Left salivary gland is not diffusely enlarged or tender.     Right Ear: Hearing, tympanic membrane, ear canal and external ear normal. No drainage. No middle ear effusion. There is no impacted cerumen. Tympanic membrane is not erythematous or bulging.     Left Ear: Hearing, tympanic membrane, ear canal and external ear normal. No drainage.  No middle ear effusion. There is no impacted cerumen. Tympanic membrane is not erythematous or bulging.     Ears:     Comments: There is a scant amount of cerumen suspended mid canal on the tips of the hairs in ear canal    Nose: Nose normal. No nasal deformity,  septal deviation, mucosal edema, congestion or rhinorrhea.     Right Turbinates: Not enlarged, swollen or pale.     Left Turbinates: Not enlarged, swollen or pale.     Right Sinus: No maxillary sinus tenderness or frontal sinus tenderness.     Left Sinus: No maxillary sinus tenderness or frontal sinus tenderness.     Mouth/Throat:     Lips: Pink. No lesions.     Mouth: Mucous membranes are moist. No oral lesions.     Pharynx: Oropharynx is clear. Uvula midline. No posterior oropharyngeal erythema or uvula swelling.     Tonsils: No tonsillar exudate. 0 on the right. 0 on the left.  Eyes:     General: Lids are normal.        Right eye: No discharge.        Left eye: No discharge.     Extraocular Movements: Extraocular movements intact.     Conjunctiva/sclera: Conjunctivae normal.     Right eye: Right conjunctiva is not injected.     Left eye: Left conjunctiva is not injected.  Neck:     Trachea: Trachea and phonation normal.  Cardiovascular:     Rate and Rhythm: Normal rate and regular rhythm.     Pulses: Normal pulses.     Heart sounds: Normal heart sounds. No murmur heard.   No friction rub. No gallop.  Pulmonary:     Effort: Pulmonary effort is normal. No accessory muscle usage, prolonged expiration or respiratory distress.     Breath sounds: Normal breath sounds. No stridor, decreased air movement or transmitted upper airway sounds. No decreased breath sounds, wheezing, rhonchi or rales.  Chest:     Chest wall: No tenderness.  Musculoskeletal:        General: Normal range of motion.     Cervical back: Normal range of motion and neck supple. Normal range of motion.  Lymphadenopathy:     Cervical: No cervical adenopathy.  Skin:    General: Skin is warm and dry.     Findings: No erythema or rash.  Neurological:     General: No focal deficit present.  Mental Status: She is alert and oriented to person, place, and time.  Psychiatric:        Mood and Affect: Mood normal.         Behavior: Behavior normal.    Visual Acuity Right Eye Distance:   Left Eye Distance:   Bilateral Distance:    Right Eye Near:   Left Eye Near:    Bilateral Near:     [facility] Couse / Diagnostics / Procedures:    EKG  Radiology No results found.  Procedures Ear Cerumen Removal  Date/Time: 04/28/2022 4:48 PM Performed by: Theadora Rama Scales, PA-C Authorized by: Theadora Rama Scales, PA-C   Consent:    Consent obtained:  Verbal   Consent given by:  Patient   Risks, benefits, and alternatives were discussed: yes     Risks discussed:  Bleeding, infection, pain, TM perforation, incomplete removal and dizziness   Alternatives discussed:  No treatment, delayed treatment, alternative treatment, observation and referral Universal protocol:    Procedure explained and questions answered to patient or proxy's satisfaction: yes     Patient identity confirmed:  Verbally with patient and arm band Procedure details:    Location:  R ear   Procedure type: irrigation     Procedure outcomes: cerumen removed   Post-procedure details:    Inspection:  Ear canal clear, no bleeding and TM intact   Hearing quality:  Normal   Procedure completion:  Tolerated (including critical care time)  UC Diagnoses / Final Clinical Impressions(s)   I have reviewed the triage vital signs and the nursing notes.  Pertinent labs & imaging results that were available during my care of the patient were reviewed by me and considered in my medical decision making (see chart for details).    Final diagnoses:  Cerumen in auditory canal on examination   Small amount of wax successfully removed from patient's ear with irrigation.  Patient tolerated well.  ED Prescriptions   None    PDMP not reviewed this encounter.  Pending results:  Labs Reviewed - No data to display  Medications Ordered in UC: Medications - No data to display  Disposition Upon Discharge:  Condition: stable for discharge  home Home: take medications as prescribed; routine discharge instructions as discussed; follow up as advised.  Patient presented with an acute illness with associated systemic symptoms and significant discomfort requiring urgent management. In my opinion, this is a condition that a prudent lay person (someone who possesses an average knowledge of health and medicine) may potentially expect to result in complications if not addressed urgently such as respiratory distress, impairment of bodily function or dysfunction of bodily organs.   Routine symptom specific, illness specific and/or disease specific instructions were discussed with the patient and/or caregiver at length.   As such, the patient has been evaluated and assessed, work-up was performed and treatment was provided in alignment with urgent care protocols and evidence based medicine.  Patient/parent/caregiver has been advised that the patient may require follow up for further testing and treatment if the symptoms continue in spite of treatment, as clinically indicated and appropriate.  Patient/parent/caregiver has been advised to return to the [facility] or PCP if no better; to PCP or the Emergency Department if new signs and symptoms develop, or if the current signs or symptoms continue to change or worsen for further workup, evaluation and treatment as clinically indicated and appropriate  The patient will follow up with their current PCP if and as advised. If the patient  does not currently have a PCP we will assist them in obtaining one.   The patient may need specialty follow up if the symptoms continue, in spite of conservative treatment and management, for further workup, evaluation, consultation and treatment as clinically indicated and appropriate.   Patient/parent/caregiver verbalized understanding and agreement of plan as discussed.  All questions were addressed during visit.  Please see discharge instructions below for further details of  plan.  Discharge Instructions:   Discharge Instructions      The wax in your ear was successfully removed today.  No further treatment as needed.      This office note has been dictated using Teaching laboratory technician.  Unfortunately, and despite my best efforts, this method of dictation can sometimes lead to occasional typographical or grammatical errors.  I apologize in advance if this occurs.     Theadora Rama Scales, PA-C 04/28/22 1659

## 2022-04-28 NOTE — Discharge Instructions (Addendum)
The wax in your ear was successfully removed today.  No further treatment as needed.

## 2022-09-09 ENCOUNTER — Encounter: Payer: Self-pay | Admitting: Certified Nurse Midwife

## 2023-09-09 ENCOUNTER — Other Ambulatory Visit (HOSPITAL_COMMUNITY)
Admission: RE | Admit: 2023-09-09 | Discharge: 2023-09-09 | Disposition: A | Discharge status: Home / Self Care | Payer: Medicaid Other | Source: Ambulatory Visit | Attending: Obstetrics | Admitting: Obstetrics

## 2023-09-09 ENCOUNTER — Encounter: Payer: Self-pay | Admitting: Obstetrics

## 2023-09-09 ENCOUNTER — Ambulatory Visit (INDEPENDENT_AMBULATORY_CARE_PROVIDER_SITE_OTHER): Payer: Medicaid Other | Admitting: Obstetrics

## 2023-09-09 VITALS — BP 118/79 | HR 65 | Ht 68.0 in | Wt 233.0 lb

## 2023-09-09 DIAGNOSIS — Z01419 Encounter for gynecological examination (general) (routine) without abnormal findings: Secondary | ICD-10-CM

## 2023-09-09 DIAGNOSIS — Z113 Encounter for screening for infections with a predominantly sexual mode of transmission: Secondary | ICD-10-CM

## 2023-09-09 DIAGNOSIS — Z124 Encounter for screening for malignant neoplasm of cervix: Secondary | ICD-10-CM | POA: Insufficient documentation

## 2023-09-09 NOTE — Progress Notes (Signed)
ANNUAL GYNECOLOGICAL EXAM  SUBJECTIVE  HPI  GENIENE LIST is a 37 y.o.-year-old [id number] who presents for an annual gynecological exam today.  She denies dyspareunia, abnormal discharge, itching, and UTI symptoms. She reports abnormal bleeding last month. She had 2 periods in one month, both lasting 4 days with her usual flow. The second period was followed by 2 days of spotting. She experienced 3 episodes of sharp lower abdominal pains randomly last month as well, not associated with bleeding. She has had regular monthly periods prior to this with 4-5 days of bleeding. She is concerned about possible fibroids although she does not have a history of this. She is sexually active with her fiance and denies known exposure to STIs. She would like to be tested for STIs with her pap today. She has also had left breast pain on and off this last month. She does not currently have the pain but would like a breast exam today. She denies any lumps or abnormalities felt on self exams. Denies redness or swelling around the area. Reports she is currently breastfeeding her 61.45-year-old. She is ready to be done but her child has not started to wean on her own.   Medical/Surgical History Past Medical History:  Diagnosis Date   Anemia    Past Surgical History:  Procedure Laterality Date   EYE SURGERY     cosmetic surgery   TUBAL LIGATION Bilateral 04/17/2021   Procedure: POST PARTUM TUBAL LIGATION;  Surgeon: Hildred Laser, MD;  Location: [facility] ORS;  Service: Gynecology;  Laterality: Bilateral;    Social History Lives with fiance and children. 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 regular periods every month Last Pap: 09/09/23 Contraception: Tubal ligation  Prevention Dentist: Sometimes Eye exam: Sometimes Mammogram: Not needed until 37 y.o. unless breast pain continues or worsens. Colonoscopy: Not needed until 37 y.o. Flu shot/vaccines  Current Medications Outpatient Medications Prior to Visit  Medication Sig   ibuprofen (ADVIL) 600 MG tablet Take 1 tablet (600 mg total) by mouth every 6 (six) hours.   Prenatal Vit-Fe Fumarate-FA (PRENATAL MULTIVITAMIN) TABS tablet Take 1 tablet by mouth daily at 12 noon.   No facility-administered medications prior to visit.      [facility] - 09/09/23 0836       Pregnancy Intention Screening   Does the patient want to become pregnant in the next year? No    Does the patient's partner want to become pregnant in the next year? No    Would the patient like to discuss contraceptive options today? No      Contraception Wrap Up   Current Method --   tubal   Contraception Counseling Provided No    How was the end contraceptive method provided? N/A            The pregnancy intention screening data noted above was reviewed. Potential methods of contraception were discussed. The patient elected to proceed with  No data recorded.   ROS Constitutional: Denied constitutional symptoms, night sweats, recent illness, fatigue, fever, insomnia and weight loss.  Eyes: Denied eye symptoms, eye pain, photophobia, vision change and visual disturbance.  Ears/Nose/Throat/Neck: Denied ear, nose, throat or neck symptoms, hearing loss, nasal discharge, sinus congestion and sore throat.  Cardiovascular: Denied cardiovascular symptoms, arrhythmia, chest pain/pressure, edema, exercise intolerance, orthopnea and palpitations.  Respiratory: Denied pulmonary symptoms, asthma, pleuritic pain, productive sputum, cough, dyspnea and wheezing.  Gastrointestinal: Denied, gastro-esophageal reflux, melena, nausea and vomiting.   Genitourinary: See HPI  Musculoskeletal: Denied musculoskeletal symptoms, stiffness, swelling, muscle weakness and myalgia.  Dermatologic: Denied dermatology symptoms, rash and scar.  Neurologic: Denied neurology symptoms, dizziness, headache, neck pain and syncope.  Psychiatric: Denied psychiatric symptoms, anxiety and depression.  Endocrine: Denied endocrine symptoms including hot flashes and night sweats.    OBJECTIVE  BP 118/79   Pulse 65   Ht 5\' 8"  (1.727 m)   Wt 233 lb (105.7 kg)   LMP 08/29/2023 (Approximate)   Breastfeeding Yes   BMI 35.43 kg/m    Physical examination General NAD, Conversant  HEENT Atraumatic; Op clear with mmm.  Normo-cephalic. Pupils reactive. Anicteric sclerae  Thyroid/Neck Smooth without nodularity or enlargement. Normal ROM.  Neck Supple.  Skin No rashes, lesions or ulceration. Normal palpated skin turgor. No nodularity.  Breasts: No masses or discharge.  Symmetric.  No axillary adenopathy.  Lungs: Clear to auscultation.No rales or wheezes. Normal Respiratory effort, no retractions.  Heart: NSR.  No murmurs or rubs appreciated. No peripheral edema  Abdomen: Soft.  Non-tender.  No masses.  No HSM. No hernia  Extremities: Moves all appropriately.  Normal ROM for age. No lymphadenopathy.  Neuro: Oriented to PPT.  Normal mood. Normal affect.     Pelvic:   Vulva: Normal appearance.  No lesions.  Vagina: No lesions or abnormalities noted.  Support: Normal pelvic support.  Urethra No masses tenderness or scarring.  Meatus Normal size without lesions or prolapse.  Cervix: Normal appearance.  No lesions. Cervical ectropion. Pap collected.  Anus: Normal exam.  No lesions.  Perineum: Normal exam.  No lesions.        Bimanual   Uterus: 8-9 weeks size.  Non-tender.  Mobile.  AV.  Adnexae: No masses.  Non-tender to palpation.  Cul-de-sac: Negative for abnormality.    ASSESSMENT  1) Annual exam 2) Abnormal bleeding  PLAN 1) Physical exam as noted.  Discussed healthy lifestyle choices and preventive care. Discussed methods for weaning an unwilling toddler. 2) Pap done in office today. STI testing added. Results pending.  Consider pelvic US if swab results are normal. 3) Screening/prevention: Breast exam done in office today. No abnormalities noted. If breast pain continues or worsens patient was advised to contact us to schedule imaging. Patient did not want basic labs done today.   Return in one year for annual exam or as needed for concerns.   Guadlupe Spanish, CNM

## 2023-09-10 LAB — HEPATITIS B SURFACE ANTIGEN: Hepatitis B Surface Ag: NEGATIVE

## 2023-09-10 LAB — HIV ANTIBODY (ROUTINE TESTING W REFLEX): HIV Screen 4th Generation wRfx: NONREACTIVE

## 2023-09-10 LAB — HEPATITIS C ANTIBODY: Hep C Virus Ab: NONREACTIVE

## 2023-09-10 LAB — RPR: RPR Ser Ql: NONREACTIVE

## 2023-09-10 LAB — HEPATITIS B SURFACE ANTIBODY,QUALITATIVE: Hep B Surface Ab, Qual: REACTIVE

## 2023-09-14 ENCOUNTER — Telehealth: Payer: Self-pay

## 2023-09-14 LAB — CYTOLOGY - PAP
Chlamydia: NEGATIVE
Comment: NEGATIVE
Comment: NEGATIVE
Comment: NEGATIVE
Comment: NORMAL
Diagnosis: NEGATIVE
Diagnosis: REACTIVE
High risk HPV: NEGATIVE
Neisseria Gonorrhea: NEGATIVE
Trichomonas: NEGATIVE

## 2023-09-14 NOTE — Telephone Encounter (Signed)
Pt calling triage wondering if someone will reach out regarding her results from recent visit. Advised provider not in office today but as soon as she reviews she will reach out to her.

## 2023-09-17 ENCOUNTER — Encounter: Payer: Self-pay | Admitting: Obstetrics
